# Patient Record
Sex: Female | Born: 1945
Health system: Southern US, Community
[De-identification: ages and names within clinical notes are randomized; demographics above are authoritative.]

## PROBLEM LIST (undated history)

## (undated) DIAGNOSIS — I83893 Varicose veins of bilateral lower extremities with other complications: Secondary | ICD-10-CM

## (undated) DIAGNOSIS — J45909 Unspecified asthma, uncomplicated: Secondary | ICD-10-CM

## (undated) DIAGNOSIS — M1990 Unspecified osteoarthritis, unspecified site: Secondary | ICD-10-CM

## (undated) DIAGNOSIS — Z9889 Other specified postprocedural states: Secondary | ICD-10-CM

## (undated) DIAGNOSIS — R112 Nausea with vomiting, unspecified: Secondary | ICD-10-CM

## (undated) DIAGNOSIS — Z8719 Personal history of other diseases of the digestive system: Secondary | ICD-10-CM

## (undated) DIAGNOSIS — D649 Anemia, unspecified: Secondary | ICD-10-CM

## (undated) HISTORY — PX: ADENOIDECTOMY: SUR15

## (undated) HISTORY — PX: TUBAL LIGATION: SHX77

## (undated) HISTORY — PX: KNEE CARTILAGE SURGERY: SHX688

## (undated) HISTORY — DX: Varicose veins of bilateral lower extremities with other complications: I83.893

---

## 1968-03-14 HISTORY — PX: FOOT SURGERY: SHX648

## 1997-08-19 ENCOUNTER — Other Ambulatory Visit: Admission: RE | Admit: 1997-08-19 | Discharge: 1997-08-19 | Payer: Self-pay | Admitting: Obstetrics and Gynecology

## 1998-08-24 ENCOUNTER — Other Ambulatory Visit: Admission: RE | Admit: 1998-08-24 | Discharge: 1998-08-24 | Payer: Self-pay | Admitting: Obstetrics and Gynecology

## 1999-08-31 ENCOUNTER — Other Ambulatory Visit: Admission: RE | Admit: 1999-08-31 | Discharge: 1999-08-31 | Payer: Self-pay | Admitting: Obstetrics and Gynecology

## 1999-09-09 ENCOUNTER — Encounter: Payer: Self-pay | Admitting: Obstetrics and Gynecology

## 1999-09-09 ENCOUNTER — Encounter: Admission: RE | Admit: 1999-09-09 | Discharge: 1999-09-09 | Payer: Self-pay | Admitting: Obstetrics and Gynecology

## 2000-09-05 ENCOUNTER — Other Ambulatory Visit: Admission: RE | Admit: 2000-09-05 | Discharge: 2000-09-05 | Payer: Self-pay | Admitting: Obstetrics and Gynecology

## 2000-09-20 ENCOUNTER — Encounter: Admission: RE | Admit: 2000-09-20 | Discharge: 2000-09-20 | Payer: Self-pay | Admitting: Obstetrics and Gynecology

## 2000-09-20 ENCOUNTER — Encounter: Payer: Self-pay | Admitting: Obstetrics and Gynecology

## 2001-09-10 ENCOUNTER — Other Ambulatory Visit: Admission: RE | Admit: 2001-09-10 | Discharge: 2001-09-10 | Payer: Self-pay | Admitting: Obstetrics and Gynecology

## 2001-09-26 ENCOUNTER — Encounter: Payer: Self-pay | Admitting: Obstetrics and Gynecology

## 2001-09-26 ENCOUNTER — Encounter: Admission: RE | Admit: 2001-09-26 | Discharge: 2001-09-26 | Payer: Self-pay | Admitting: Obstetrics and Gynecology

## 2002-08-26 ENCOUNTER — Ambulatory Visit (HOSPITAL_COMMUNITY): Admission: RE | Admit: 2002-08-26 | Discharge: 2002-08-26 | Payer: Self-pay | Admitting: Gastroenterology

## 2002-09-20 ENCOUNTER — Other Ambulatory Visit: Admission: RE | Admit: 2002-09-20 | Discharge: 2002-09-20 | Payer: Self-pay | Admitting: Obstetrics and Gynecology

## 2002-10-16 ENCOUNTER — Encounter: Payer: Self-pay | Admitting: Obstetrics and Gynecology

## 2002-10-16 ENCOUNTER — Encounter: Admission: RE | Admit: 2002-10-16 | Discharge: 2002-10-16 | Payer: Self-pay | Admitting: Obstetrics and Gynecology

## 2003-11-20 ENCOUNTER — Ambulatory Visit (HOSPITAL_COMMUNITY): Admission: RE | Admit: 2003-11-20 | Discharge: 2003-11-20 | Payer: Self-pay | Admitting: Obstetrics and Gynecology

## 2004-01-20 ENCOUNTER — Other Ambulatory Visit: Admission: RE | Admit: 2004-01-20 | Discharge: 2004-01-20 | Payer: Self-pay | Admitting: Obstetrics and Gynecology

## 2004-02-16 ENCOUNTER — Encounter: Admission: RE | Admit: 2004-02-16 | Discharge: 2004-05-16 | Payer: Self-pay | Admitting: Obstetrics and Gynecology

## 2004-05-18 ENCOUNTER — Emergency Department (HOSPITAL_COMMUNITY): Admission: EM | Admit: 2004-05-18 | Discharge: 2004-05-18 | Payer: Self-pay | Admitting: Emergency Medicine

## 2005-02-14 ENCOUNTER — Ambulatory Visit (HOSPITAL_COMMUNITY): Admission: RE | Admit: 2005-02-14 | Discharge: 2005-02-14 | Payer: Self-pay | Admitting: Obstetrics and Gynecology

## 2005-02-23 ENCOUNTER — Encounter: Admission: RE | Admit: 2005-02-23 | Discharge: 2005-02-23 | Payer: Self-pay | Admitting: Obstetrics and Gynecology

## 2005-03-23 ENCOUNTER — Other Ambulatory Visit: Admission: RE | Admit: 2005-03-23 | Discharge: 2005-03-23 | Payer: Self-pay | Admitting: Obstetrics and Gynecology

## 2006-02-20 ENCOUNTER — Ambulatory Visit (HOSPITAL_COMMUNITY): Admission: RE | Admit: 2006-02-20 | Discharge: 2006-02-20 | Payer: Self-pay | Admitting: Obstetrics and Gynecology

## 2006-03-01 ENCOUNTER — Encounter: Admission: RE | Admit: 2006-03-01 | Discharge: 2006-03-01 | Payer: Self-pay | Admitting: Obstetrics and Gynecology

## 2007-02-27 ENCOUNTER — Encounter: Admission: RE | Admit: 2007-02-27 | Discharge: 2007-02-27 | Payer: Self-pay | Admitting: Obstetrics and Gynecology

## 2007-07-11 ENCOUNTER — Ambulatory Visit (HOSPITAL_BASED_OUTPATIENT_CLINIC_OR_DEPARTMENT_OTHER): Admission: RE | Admit: 2007-07-11 | Discharge: 2007-07-11 | Payer: Self-pay | Admitting: Orthopedic Surgery

## 2008-02-28 ENCOUNTER — Encounter: Admission: RE | Admit: 2008-02-28 | Discharge: 2008-02-28 | Payer: Self-pay | Admitting: Obstetrics and Gynecology

## 2009-03-02 ENCOUNTER — Encounter: Admission: RE | Admit: 2009-03-02 | Discharge: 2009-03-02 | Payer: Self-pay | Admitting: Obstetrics and Gynecology

## 2010-03-03 ENCOUNTER — Encounter
Admission: RE | Admit: 2010-03-03 | Discharge: 2010-03-03 | Payer: Self-pay | Source: Home / Self Care | Attending: Obstetrics and Gynecology | Admitting: Obstetrics and Gynecology

## 2010-04-04 ENCOUNTER — Encounter: Payer: Self-pay | Admitting: Obstetrics and Gynecology

## 2010-07-27 NOTE — Op Note (Signed)
NAMEKYLE, STANSELL                ACCOUNT NO.:  [id number]   MEDICAL RECORD NO.:  [id number]          PATIENT TYPE:  AMB   LOCATION:  [facility]                         FACILITY:  [facility]   PHYSICIAN:  Ollen Gross, M.D.    DATE OF BIRTH:  1945-08-11   DATE OF PROCEDURE:  07/11/2007  DATE OF DISCHARGE:                               OPERATIVE REPORT   PREOPERATIVE DIAGNOSIS:  Right knee lateral meniscal tear.   POSTOPERATIVE DIAGNOSIS:  Right knee lateral meniscal tear.   PROCEDURE:  Right knee arthroscopy with lateral meniscal debridement.   SURGEON:  Ollen Gross, M.D.   ASSISTANT:  None.   ANESTHESIA:  General.   BLOOD LOSS:  Minimal.   DRAINS:  None.   COMPLICATIONS:  None.   CONDITION:  Stable to recovery.   CLINICAL NOTE:  Marie Farrell is a 65 year old female with significant right  knee pain, mechanical symptoms and these have progressively worsened  over time.  She has documented lateral meniscal tear as well as some  arthritis in the lateral compartment.  Given her significant worsening  pain and mechanical symptoms, it is decided to perform arthroscopic  debridement.   PROCEDURE IN DETAIL:  After successful administration of general  anesthetic, a tourniquet is placed high on the right side and right  lower extremity prepped and draped in the usual sterile fashion.  Standard superomedial and inferolateral incisions are made.  Inflow  cannula passed superomedial and camera passed inferolateral.  Arthroscopic visualization proceeds.  Undersurface of patella shows some  grade 2 chondromalacia, no full-thickness chondral defects.  The  trochlea looks normal.  The medial and lateral gutters are visualized.  There are no loose bodies.  Flexion and valgus force applied to the knee  and the medial compartment is entered.  The medial compartment has  minimal chondromalacia and no meniscal tear.  The spinal needle is used  to localize the inferomedial portal, small incision made  and dilator  placed.  We then visualized the intercondylar notch and the ACL is  normal.  Lateral compartment centered and there is evidence of a  significant lesion on the lateral tibial plateau with exposed bone  posterolaterally about a 1 x 2 cm area.  The lateral meniscus is also  significantly torn and degenerated.  From the body all through the  posterior horn, there is significant tearing in degeneration.  The  lateral femoral condyle looks fairly normal.  The meniscus is debrided  back to a stable rim with baskets and a 4.2 mm shaver and sealed off  with the ArthroCare device.  As there was exposed bone on the tibial  plateau, I was not able to do a chondroplasty.  The patellofemoral joint  is then addressed and any unstable cartilage on the undersurface of the  patella is debrided back to stable base.  We then reinspected the joint  and did not note any other tears or loose bodies.  The arthroscopic  equipment is then removed from the inferior portals which are closed  with interrupted 4-0 nylon.  We injected 20 mL of 25% Marcaine with  epinephrine through the inflow cannula, then that is removed and that  portal closed with nylon.  A bulky sterile dressing is then applied and  she is awakened and transferred to recovery in stable condition.      Ollen Gross, M.D.  Electronically Signed     FA/MEDQ  D:  07/11/2007  T:  07/11/2007  Job:  [id number]

## 2010-07-30 NOTE — Op Note (Signed)
   NAME:  Marie Farrell, Marie Farrell                          ACCOUNT NO.:  [id number]   MEDICAL RECORD NO.:  [id number]                   PATIENT TYPE:  AMB   LOCATION:  ENDO                                 FACILITY:  [facility]   PHYSICIAN:  Marie Farrell, M.D.                DATE OF BIRTH:  1945/06/14   DATE OF PROCEDURE:  08/26/2002  DATE OF DISCHARGE:                                 OPERATIVE REPORT   PROCEDURE:  Colonoscopy.   PROCEDURE INDICATION:  [name] is a 65 year old female, born  16-Feb-1946.  [name] is scheduled to undergo her first screening  colonoscopy with polypectomy to prevent colon cancer.   ENDOSCOPIST:  Charolett Bumpers, M.D.   PREMEDICATION:  1. Fentanyl 75 mcg.  2. Versed 7 mg.   DESCRIPTION OF PROCEDURE:  After obtaining informed consent, [name] was  placed in the left lateral decubitus position.  I administered intravenous  fentanyl and intravenous Versed to achieve conscious sedation for the  procedure.  The patient's blood pressure, oxygen saturation, and cardiac  rhythm were monitored throughout the procedure and documented in the medical  record.   Anal inspection was normal.  Digital rectal exam was normal.  The Olympus  pediatric adjustable colonoscope was introduced into the rectum and easily  advanced to the cecum.  Colonic preparation for the exam today was  excellent.   RECTUM:  Normal.  SIGMOID COLON AND DESCENDING COLON:  Normal.  SPLENIC FLEXURE:  Normal.  TRANSVERSE COLON:  Normal.  HEPATIC FLEXURE:  Normal.  ASCENDING COLON:  Normal.  CECUM AND ILEOCECAL VALVE:  Normal.    ASSESSMENT:  Normal screening proctocolonoscopy to the cecum.  No endoscopic  evidence for the presence of colorectal neoplasia.                                               Marie Farrell, M.D.    MJ/MEDQ  D:  08/26/2002  T:  08/26/2002  Job:  [id number]   cc:   Juluis Mire, M.D.  [address] [place]  [place] [address]  Fax:  [phone]

## 2010-12-07 LAB — POCT HEMOGLOBIN-HEMACUE: Hemoglobin: 14

## 2011-01-27 ENCOUNTER — Other Ambulatory Visit: Payer: Self-pay | Admitting: Obstetrics and Gynecology

## 2011-01-27 DIAGNOSIS — Z1231 Encounter for screening mammogram for malignant neoplasm of breast: Secondary | ICD-10-CM

## 2011-03-10 ENCOUNTER — Ambulatory Visit
Admission: RE | Admit: 2011-03-10 | Discharge: 2011-03-10 | Disposition: A | Discharge status: Home / Self Care | Payer: BC Managed Care – PPO | Source: Ambulatory Visit | Attending: Obstetrics and Gynecology | Admitting: Obstetrics and Gynecology

## 2011-03-10 DIAGNOSIS — Z1231 Encounter for screening mammogram for malignant neoplasm of breast: Secondary | ICD-10-CM

## 2011-03-18 ENCOUNTER — Other Ambulatory Visit: Payer: Self-pay | Admitting: Obstetrics and Gynecology

## 2011-03-18 DIAGNOSIS — R928 Other abnormal and inconclusive findings on diagnostic imaging of breast: Secondary | ICD-10-CM

## 2011-03-28 ENCOUNTER — Ambulatory Visit
Admission: RE | Admit: 2011-03-28 | Discharge: 2011-03-28 | Disposition: A | Discharge status: Home / Self Care | Payer: BC Managed Care – PPO | Source: Ambulatory Visit | Attending: Obstetrics and Gynecology | Admitting: Obstetrics and Gynecology

## 2011-03-28 DIAGNOSIS — R928 Other abnormal and inconclusive findings on diagnostic imaging of breast: Secondary | ICD-10-CM

## 2012-02-27 ENCOUNTER — Other Ambulatory Visit: Payer: Self-pay | Admitting: Obstetrics and Gynecology

## 2012-02-27 DIAGNOSIS — Z1231 Encounter for screening mammogram for malignant neoplasm of breast: Secondary | ICD-10-CM

## 2012-04-09 ENCOUNTER — Ambulatory Visit
Admission: RE | Admit: 2012-04-09 | Discharge: 2012-04-09 | Disposition: A | Discharge status: Home / Self Care | Payer: BC Managed Care – PPO | Source: Ambulatory Visit | Attending: Obstetrics and Gynecology | Admitting: Obstetrics and Gynecology

## 2012-04-09 DIAGNOSIS — Z1231 Encounter for screening mammogram for malignant neoplasm of breast: Secondary | ICD-10-CM

## 2013-03-18 ENCOUNTER — Other Ambulatory Visit: Payer: Self-pay

## 2013-03-18 DIAGNOSIS — Z1231 Encounter for screening mammogram for malignant neoplasm of breast: Secondary | ICD-10-CM

## 2013-04-10 ENCOUNTER — Ambulatory Visit
Admission: RE | Admit: 2013-04-10 | Discharge: 2013-04-10 | Disposition: A | Discharge status: Home / Self Care | Payer: BC Managed Care – PPO | Source: Ambulatory Visit

## 2013-04-10 DIAGNOSIS — Z1231 Encounter for screening mammogram for malignant neoplasm of breast: Secondary | ICD-10-CM

## 2014-03-21 ENCOUNTER — Other Ambulatory Visit: Payer: Self-pay

## 2014-03-21 DIAGNOSIS — Z1231 Encounter for screening mammogram for malignant neoplasm of breast: Secondary | ICD-10-CM

## 2014-04-22 ENCOUNTER — Ambulatory Visit
Admission: RE | Admit: 2014-04-22 | Discharge: 2014-04-22 | Disposition: A | Discharge status: Home / Self Care | Payer: Medicare Other | Source: Ambulatory Visit

## 2014-04-22 DIAGNOSIS — Z1231 Encounter for screening mammogram for malignant neoplasm of breast: Secondary | ICD-10-CM

## 2014-10-01 ENCOUNTER — Other Ambulatory Visit: Payer: Self-pay | Admitting: Gastroenterology

## 2014-11-26 ENCOUNTER — Encounter (HOSPITAL_COMMUNITY): Payer: Self-pay | Admitting: *Deleted

## 2014-12-09 ENCOUNTER — Ambulatory Visit (HOSPITAL_COMMUNITY)
Admission: RE | Admit: 2014-12-09 | Discharge: 2014-12-09 | Disposition: A | Discharge status: Home / Self Care | Payer: Medicare Other | Source: Ambulatory Visit | Attending: Gastroenterology | Admitting: Gastroenterology

## 2014-12-09 ENCOUNTER — Ambulatory Visit (HOSPITAL_COMMUNITY): Payer: Medicare Other | Admitting: Certified Registered Nurse Anesthetist

## 2014-12-09 ENCOUNTER — Encounter (HOSPITAL_COMMUNITY)
Admission: RE | Disposition: A | Discharge status: Home / Self Care | Payer: Self-pay | Source: Ambulatory Visit | Attending: Gastroenterology

## 2014-12-09 ENCOUNTER — Encounter (HOSPITAL_COMMUNITY): Payer: Self-pay

## 2014-12-09 DIAGNOSIS — Z1211 Encounter for screening for malignant neoplasm of colon: Secondary | ICD-10-CM | POA: Insufficient documentation

## 2014-12-09 DIAGNOSIS — J45909 Unspecified asthma, uncomplicated: Secondary | ICD-10-CM | POA: Insufficient documentation

## 2014-12-09 DIAGNOSIS — K449 Diaphragmatic hernia without obstruction or gangrene: Secondary | ICD-10-CM | POA: Insufficient documentation

## 2014-12-09 HISTORY — DX: Personal history of other diseases of the digestive system: Z87.19

## 2014-12-09 HISTORY — DX: Unspecified asthma, uncomplicated: J45.909

## 2014-12-09 HISTORY — DX: Anemia, unspecified: D64.9

## 2014-12-09 HISTORY — PX: COLONOSCOPY WITH PROPOFOL: SHX5780

## 2014-12-09 HISTORY — DX: Nausea with vomiting, unspecified: R11.2

## 2014-12-09 HISTORY — DX: Other specified postprocedural states: Z98.890

## 2014-12-09 SURGERY — COLONOSCOPY WITH PROPOFOL
Anesthesia: Monitor Anesthesia Care

## 2014-12-09 MED ORDER — LACTATED RINGERS IV SOLN
INTRAVENOUS | Status: DC
  Administered 2014-12-09: 1000 mL via INTRAVENOUS

## 2014-12-09 MED ORDER — PROPOFOL 10 MG/ML IV BOLUS
INTRAVENOUS | Status: AC
Start: 2014-12-09 — End: 2014-12-09
  Filled 2014-12-09: qty 20

## 2014-12-09 MED ORDER — PROPOFOL 10 MG/ML IV BOLUS
INTRAVENOUS | Status: AC
  Filled 2014-12-09: qty 20

## 2014-12-09 MED ORDER — LIDOCAINE HCL (CARDIAC) 20 MG/ML IV SOLN
INTRAVENOUS | Status: AC
  Filled 2014-12-09: qty 5

## 2014-12-09 MED ORDER — PROPOFOL 500 MG/50ML IV EMUL
INTRAVENOUS | Status: DC | PRN
  Administered 2014-12-09: 300 ug/kg/min via INTRAVENOUS

## 2014-12-09 MED ORDER — LIDOCAINE HCL (CARDIAC) 20 MG/ML IV SOLN
INTRAVENOUS | Status: DC | PRN
  Administered 2014-12-09: 100 mg via INTRAVENOUS

## 2014-12-09 MED ORDER — SODIUM CHLORIDE 0.9 % IV SOLN: INTRAVENOUS | Status: DC

## 2014-12-09 MED ORDER — ONDANSETRON HCL 4 MG/2ML IJ SOLN
INTRAMUSCULAR | Status: AC
  Filled 2014-12-09: qty 2

## 2014-12-09 MED ORDER — ONDANSETRON HCL 4 MG/2ML IJ SOLN
INTRAMUSCULAR | Status: DC | PRN
  Administered 2014-12-09: 4 mg via INTRAVENOUS

## 2014-12-09 SURGICAL SUPPLY — 21 items

## 2014-12-09 NOTE — Anesthesia Postprocedure Evaluation (Signed)
  Anesthesia Post-op Note  Patient: Marie Farrell  Procedure(s) Performed: Procedure(s): COLONOSCOPY WITH PROPOFOL (N/A)  Patient Location: PACU and Endoscopy Unit  Anesthesia Type:MAC  Level of Consciousness: awake  Airway and Oxygen Therapy: Patient Spontanous Breathing  Post-op Pain: mild  Post-op Assessment: Post-op Vital signs reviewed              Post-op Vital Signs: Reviewed  Last Vitals:  Filed Vitals:   12/09/14 0910  BP: 104/64  Pulse: 68  Temp:   Resp: 18    Complications: No apparent anesthesia complications

## 2014-12-09 NOTE — Op Note (Signed)
Procedure: Screening colonoscopy. Normal screening colonoscopy performed on 08/26/2002  Endoscopist: Danise Edge  Sedation: Propofol administered by anesthesia  Procedure: The patient was placed in the left lateral decubitus position. Anal inspection and digital rectal exam were normal. The Pentax pediatric colonoscope was introduced into the rectum and advanced to the cecum. A normal-appearing appendiceal orifice and ileocecal valve were identified. Colonic preparation for the exam today was good. Withdrawal time was 10 minutes  Rectum. Normal. Retroflexed view of the distal rectum was normal  Sigmoid colon and descending colon. Normal  Splenic flexure. Normal  Transverse colon. Normal  Hepatic flexure. Normal  Ascending colon. Normal  Cecum and ileocecal valve. Normal  Assessment: Normal screening colonoscopy.

## 2014-12-09 NOTE — Anesthesia Preprocedure Evaluation (Addendum)
Anesthesia Evaluation  Patient identified by MRN, date of birth, ID band Patient awake    Reviewed: Allergy & Precautions, NPO status , Patient's Chart, lab work & pertinent test results  History of Anesthesia Complications (+) PONV  Airway Mallampati: II  TM Distance: >3 FB Neck ROM: Full    Dental   Pulmonary asthma ,    breath sounds clear to auscultation       Cardiovascular negative cardio ROS   Rhythm:Regular Rate:Normal     Neuro/Psych    GI/Hepatic Neg liver ROS, hiatal hernia,   Endo/Other  negative endocrine ROS  Renal/GU negative Renal ROS     Musculoskeletal   Abdominal   Peds  Hematology   Anesthesia Other Findings   Reproductive/Obstetrics                             Anesthesia Physical Anesthesia Plan  ASA: II  Anesthesia Plan: MAC   Post-op Pain Management:    Induction: Intravenous  Airway Management Planned: Nasal Cannula  Additional Equipment:   Intra-op Plan:   Post-operative Plan:   Informed Consent: I have reviewed the patients History and Physical, chart, labs and discussed the procedure including the risks, benefits and alternatives for the proposed anesthesia with the patient or authorized representative who has indicated his/her understanding and acceptance.   Dental advisory given  Plan Discussed with: CRNA and Anesthesiologist  Anesthesia Plan Comments:        Anesthesia Quick Evaluation

## 2014-12-09 NOTE — H&P (Signed)
  Procedure: Screening colonoscopy. Normal screening colonoscopy performed on 08/26/2002  History: The patient is a 69 year old female born to//1947. She is scheduled to undergo a screening colonoscopy today.  Past medical history: Left knee arthroscopy. Mohs surgery melanoma  Medication allergies: Codeine causes nausea and vomiting  Exam: The patient is alert and lying comfortably on the endoscopy stretcher. Abdomen is soft and nontender to palpation. Lungs are clear  to auscultation. Cardiac exam reveals a regular rhythm.  Plan: Proceed with screening colonoscopy

## 2014-12-09 NOTE — Discharge Instructions (Signed)
Colonoscopy, Care After °Refer to this sheet in the next few weeks. These instructions provide you with information on caring for yourself after your procedure. Your health care provider may also give you more specific instructions. Your treatment has been planned according to current medical practices, but problems sometimes occur. Call your health care provider if you have any problems or questions after your procedure. °WHAT TO EXPECT AFTER THE PROCEDURE  °After your procedure, it is typical to have the following: °· A small amount of blood in your stool. °· Moderate amounts of gas and mild abdominal cramping or bloating. °HOME CARE INSTRUCTIONS °· Do not drive, operate machinery, or sign important documents for 24 hours. °· You may shower and resume your regular physical activities, but move at a slower pace for the first 24 hours. °· Take frequent rest periods for the first 24 hours. °· Walk around or put a warm pack on your abdomen to help reduce abdominal cramping and bloating. °· Drink enough fluids to keep your urine clear or pale yellow. °· You may resume your normal diet as instructed by your health care provider. Avoid heavy or fried foods that are hard to digest. °· Avoid drinking alcohol for 24 hours or as instructed by your health care provider. °· Only take over-the-counter or prescription medicines as directed by your health care provider. °· If a tissue sample (biopsy) was taken during your procedure: °· Do not take aspirin or blood thinners for 7 days, or as instructed by your health care provider. °· Do not drink alcohol for 7 days, or as instructed by your health care provider. °· Eat soft foods for the first 24 hours. °SEEK MEDICAL CARE IF: °You have persistent spotting of blood in your stool 2-3 days after the procedure. °SEEK IMMEDIATE MEDICAL CARE IF: °· You have more than a small spotting of blood in your stool. °· You pass large blood clots in your stool. °· Your abdomen is swollen  (distended). °· You have nausea or vomiting. °· You have a fever. °· You have increasing abdominal pain that is not relieved with medicine. °Document Released: 10/13/2003 Document Revised: 12/19/2012 Document Reviewed: 11/05/2012 °ExitCare® Patient Information ©2015 [employer]. This information is not intended to replace advice given to you by your health care provider. Make sure you discuss any questions you have with your health care provider. ° °Monitored Anesthesia Care °Monitored anesthesia care is an anesthesia service for a medical procedure. Anesthesia is the loss of the ability to feel pain. It is produced by medicines called anesthetics. It may affect a small area of your body (local anesthesia), a large area of your body (regional anesthesia), or your entire body (general anesthesia). The need for monitored anesthesia care depends your procedure, your condition, and the potential need for regional or general anesthesia. It is often provided during procedures where:  °· General anesthesia may be needed if there are complications. This is because you need special care when you are under general anesthesia.   °· You will be under local or regional anesthesia. This is so that you are able to have higher levels of anesthesia if needed.   °· You will receive calming medicines (sedatives). This is especially the case if sedatives are given to put you in a semi-conscious state of relaxation (deep sedation). This is because the amount of sedative needed to produce this state can be hard to predict. Too much of a sedative can produce general anesthesia. °Monitored anesthesia care is performed by one   or more health care providers who have special training in all types of anesthesia. You will need to meet with these health care providers before your procedure. During this meeting, they will ask you about your medical history. They will also give you instructions to follow. (For example, you will need to stop  eating and drinking before your procedure. You may also need to stop or change medicines you are taking.) During your procedure, your health care providers will stay with you. They will:  °· Watch your condition. This includes watching your blood pressure, breathing, and level of pain.   °· Diagnose and treat problems that occur.   °· Give medicines if they are needed. These may include calming medicines (sedatives) and anesthetics.   °· Make sure you are comfortable.   °Having monitored anesthesia care does not necessarily mean that you will be under anesthesia. It does mean that your health care providers will be able to manage anesthesia if you need it or if it occurs. It also means that you will be able to have a different type of anesthesia than you are having if you need it. When your procedure is complete, your health care providers will continue to watch your condition. They will make sure any medicines wear off before you are allowed to go home.  °Document Released: 11/24/2004 Document Revised: 07/15/2013 Document Reviewed: 04/11/2012 °ExitCare® Patient Information ©2015 [employer]. This information is not intended to replace advice given to you by your health care provider. Make sure you discuss any questions you have with your health care provider. ° ° °

## 2014-12-09 NOTE — Transfer of Care (Signed)
Immediate Anesthesia Transfer of Care Note  Patient: Marie Farrell  Procedure(s) Performed: Procedure(s): COLONOSCOPY WITH PROPOFOL (N/A)  Patient Location: PACU  Anesthesia Type:MAC  Level of Consciousness:  sedated, patient cooperative and responds to stimulation  Airway & Oxygen Therapy:Patient Spontanous Breathing and Patient connected to face mask oxgen  Post-op Assessment:  Report given to PACU RN and Post -op Vital signs reviewed and stable  Post vital signs:  Reviewed and stable  Last Vitals:  Filed Vitals:   12/09/14 0814  BP: 132/72  Pulse: 73  Temp: 36.7 C  Resp: 18    Complications: No apparent anesthesia complications

## 2014-12-11 ENCOUNTER — Encounter (HOSPITAL_COMMUNITY): Payer: Self-pay | Admitting: Gastroenterology

## 2015-04-13 ENCOUNTER — Other Ambulatory Visit: Payer: Self-pay

## 2015-04-13 DIAGNOSIS — Z1231 Encounter for screening mammogram for malignant neoplasm of breast: Secondary | ICD-10-CM

## 2015-05-05 ENCOUNTER — Ambulatory Visit
Admission: RE | Admit: 2015-05-05 | Discharge: 2015-05-05 | Disposition: A | Discharge status: Home / Self Care | Payer: Medicare Other | Source: Ambulatory Visit

## 2015-05-05 DIAGNOSIS — Z1231 Encounter for screening mammogram for malignant neoplasm of breast: Secondary | ICD-10-CM

## 2016-03-01 ENCOUNTER — Encounter: Payer: Medicare Other | Admitting: Vascular Surgery

## 2016-03-04 ENCOUNTER — Encounter: Payer: Self-pay | Admitting: Vascular Surgery

## 2016-03-21 ENCOUNTER — Encounter: Payer: Medicare Other | Admitting: Vascular Surgery

## 2016-03-24 ENCOUNTER — Encounter: Payer: Self-pay | Admitting: Vascular Surgery

## 2016-03-28 ENCOUNTER — Encounter: Payer: Self-pay | Admitting: Vascular Surgery

## 2016-03-28 ENCOUNTER — Ambulatory Visit (INDEPENDENT_AMBULATORY_CARE_PROVIDER_SITE_OTHER): Payer: Medicare Other | Admitting: Vascular Surgery

## 2016-03-28 VITALS — BP 132/83 | HR 96 | Temp 98.0°F | Resp 16 | Ht 66.0 in | Wt 167.0 lb

## 2016-03-28 DIAGNOSIS — I781 Nevus, non-neoplastic: Secondary | ICD-10-CM | POA: Diagnosis not present

## 2016-03-28 NOTE — Progress Notes (Signed)
Subjective:     Patient ID: Marie Farrell, female   DOB: 03/14/1945, 71 y.o.   MRN: [id number]  HPI This 71 year old female was referred by Dr. Catha GosselinKevin Little for evaluation of varicose veins bilaterally with swelling left leg. Patient has had some mild swelling in the left lower leg over the past several years. She does not relax to compression stockings and she has no history of DVT thrombophlebitis stasis ulcers or bleeding. She has noted progressive spider veins in both legs in the lateral 5 posterior thigh and lateral calf areas. She [facility] but they recurred in 2 years. She denies any pain in her legs at this time. She has not had any recent ultrasound or vein evaluation.  Past Medical History:  Diagnosis Date  . Anemia    as a child none current   . Asthma    steroid, inhalors, antibiotics, last winter no current issues  . History of hiatal hernia    no treatment  . PONV (postoperative nausea and vomiting)     Social History  Substance Use Topics  . Smoking status: Never Smoker  . Smokeless tobacco: Never Used  . Alcohol use No    History reviewed. No pertinent family history.  Allergies  Allergen Reactions  . Codeine Nausea And Vomiting     Current Outpatient Prescriptions:  .  Cholecalciferol (VITAMIN D) 2000 UNITS CAPS, Take 2,000 Units by mouth daily., Disp: , Rfl:  .  glucosamine-chondroitin 500-400 MG tablet, Take 1 tablet by mouth daily., Disp: , Rfl:  .  ibuprofen (ADVIL,MOTRIN) 200 MG tablet, Take 400 mg by mouth every 6 (six) hours as needed for moderate pain., Disp: , Rfl:  .  Multiple Vitamin (MULTIVITAMIN WITH MINERALS) TABS tablet, Take 1 tablet by mouth daily., Disp: , Rfl:  .  Omega-3 Fatty Acids (FISH OIL PO), Take 1 capsule by mouth daily., Disp: , Rfl:  .  Probiotic Product (PROBIOTIC DAILY PO), Take 1 capsule by mouth daily., Disp: , Rfl:  .  acetaminophen (TYLENOL) 325 MG  tablet, Take 650 mg by mouth every 6 (six) hours as needed for moderate pain., Disp: , Rfl:  .  aspirin 81 MG tablet, Take 81 mg by mouth daily., Disp: , Rfl:   Vitals:   03/28/16 1201  BP: 132/83  Pulse: 96  Resp: 16  Temp: 98 F (36.7 C)  SpO2: 100%  Weight: 167 lb (75.8 kg)  Height: 5\' 6"  (1.676 m)    Body mass index is 26.95 kg/m.         Review of Systems Patient has history of occasional tachycardia with irregular heartbeat but no history of definite arrhythmias has been diagnosed. Also has history of arthroscopic surgery in the knee and removal of melanoma by Dr. Irene LimboGoodrich. Denies coronary artery disease area mild hyperlipidemia. Other systems negative and complete review of systems    Objective:   Physical Exam BP 132/83   Pulse 96   Temp 98 F (36.7 C)   Resp 16   Ht 5\' 6"  (1.676 m)   Wt 167 lb (75.8 kg)   SpO2 100%   BMI 26.95 kg/m     Gen.-alert and oriented x3 in no apparent distress HEENT normal for age Lungs no rhonchi or wheezing Cardiovascular regular rhythm no murmurs carotid pulses 3+ palpable no bruits audible Abdomen soft nontender no palpable masses Musculoskeletal free of  major deformities Skin clear -no rashes  Neurologic normal Lower extremities 3+ femoral and dorsalis pedis pulses palpable bilaterally with no edema on the right 1+ on the lower leg on the left. Diffuse spider telangiectasia lateral thighs bilaterally posterior thighs more prominently on the right medial and lateral calf. No hyperpigmentation or ulceration noted. No bulging varicosities noted.  Today I performed a bedside SonoSite ultrasound exam of both lower extremities in both great saphenous veins appear normal in caliber with no evidence of reflux     Assessment:     #1 bilateral spider telangiectasia-asymptomatic #2 swelling distal left leg-etiology unknown     Plan:     Have discussed foam sclerotherapy with patient and indicated that results can be variable  and that reoccurrence of spider veins is a possibility and she will consider this Have recommended elevation of foot of bed 2 inches and using lower leg elastic compression stockings 20-30 millimeter gradient to treat the edema in the left leg  Off her to her obtaining formal venous duplex exam which I do not think will change her treatment regimen and she declined Return to see me on a when necessary basis

## 2016-05-02 ENCOUNTER — Other Ambulatory Visit: Payer: Self-pay | Admitting: Family Medicine

## 2016-05-02 DIAGNOSIS — Z1231 Encounter for screening mammogram for malignant neoplasm of breast: Secondary | ICD-10-CM

## 2016-05-16 ENCOUNTER — Ambulatory Visit
Admission: RE | Admit: 2016-05-16 | Discharge: 2016-05-16 | Disposition: A | Discharge status: Home / Self Care | Payer: Medicare Other | Source: Ambulatory Visit | Attending: Family Medicine | Admitting: Family Medicine

## 2016-05-16 DIAGNOSIS — Z1231 Encounter for screening mammogram for malignant neoplasm of breast: Secondary | ICD-10-CM

## 2017-05-01 ENCOUNTER — Other Ambulatory Visit: Payer: Self-pay | Admitting: Family Medicine

## 2017-05-01 DIAGNOSIS — Z1231 Encounter for screening mammogram for malignant neoplasm of breast: Secondary | ICD-10-CM

## 2017-05-22 ENCOUNTER — Ambulatory Visit: Payer: Medicare Other

## 2017-05-23 ENCOUNTER — Other Ambulatory Visit: Payer: Self-pay

## 2017-05-23 ENCOUNTER — Encounter: Payer: Self-pay | Admitting: *Deleted

## 2017-05-29 NOTE — Discharge Instructions (Signed)
INSTRUCTIONS FOLLOWING OCULOPLASTIC SURGERY °AMY M. FOWLER, MD ° °AFTER YOUR EYE SURGERY, THER ARE MANY THINGS THWIHC YOU, THE PATIENT, CAN DO TO ASSURE THE BEST POSSIBLE RESULT FROM YOUR OPERATION.  THIS SHEET SHOULD BE REFERRED TO WHENEVER QUESTIONS ARISE.  IF THERE ARE ANY QUESTIONS NOT ANSWERED HERE, DO NOT HESITATE TO CALL OUR OFFICE AT [phone] OR [phone].  THERE IS ALWAYS [facility] AVAILABLE TO CALL IF QUESTIONS OR PROBLEMS ARISE. ° °VISION: Your vision may be blurred and out of focus after surgery until you are able to stop using your ointment, swelling resolves and your eye(s) heal. This may take 1 to 2 weeks at the least.  If your vision becomes gradually more dim or dark, this is not normal and you need to call our office immediately. ° °EYE CARE: For the first 48 hours after surgery, use ice packs frequently - “20 minutes on, 20 minutes off” - to help reduce swelling and bruising.  Small bags of frozen peas or corn make good ice packs along with cloths soaked in ice water.  If you are wearing a patch or other type of dressing following surgery, keep this on for the amount of time specified by your doctor.  For the first week following surgery, you will need to treat your stitches with great care.  If is OK to shower, but take care to not allow soapy water to run into your eye(s) to help reduce changes of infection.  You may gently clean the eyelashes and around the eye(s) with cotton balls and sterile water, BUT DO NOT RUB THE STITCHES VIGOROUSLY.  Keeping your stitches moist with ointment will help promote healing with minimal scar formation. ° °ACTIVITY: When you leave the surgery center, you should go home, rest and be inactive.  The eye(s) may feel scratchy and keeping the eyes closed will allow for faster healing.  The first week following surgery, avoid straining (anything making the face turn red) or lifting over 20 pounds.  Additionally, avoid bending which causes your head to go below  your waist.  Using your eyes will NOT harm them, so feel free to read, watch television, use the computer, etc as desired.  Driving depends on each individual, so check with your doctor if you have questions about driving. ° °MEDICATIONS:  You will be given a prescription for an ointment to use 4 times a day on your stitches.  You can use the ointment in your eyes if they feel scratchy or irritated.  If you eyelid(s) don’t close completely when you sleep, put some ointment in your eyes before bedtime. ° °EMERGENCY: If you experience SEVERE EYE PAIN OR HEADACHE UNRELIEVED BY TYLENOL OR PERCOCET, NAUSEA OR VOMITING, WORSENING REDNESS, OR WORSENING VISION (ESPECIALLY VISION THAT WA INITIALLY BETTER) CALL [phone] OR [phone] DURING BUSINESS HOURS OR AFTER HOURS. ° °General Anesthesia, Adult, Care After °These instructions provide you with information about caring for yourself after your procedure. Your health care provider may also give you more specific instructions. Your treatment has been planned according to current medical practices, but problems sometimes occur. Call your health care provider if you have any problems or questions after your procedure. °What can I expect after the procedure? °After the procedure, it is common to have: °· Vomiting. °· A sore throat. °· Mental slowness. ° °It is common to feel: °· Nauseous. °· Cold or shivery. °· Sleepy. °· Tired. °· Sore or achy, even in parts of your body where you did not have surgery. ° °  Follow these instructions at home: °For at least 24 hours after the procedure: °· Do not: °? Participate in activities where you could fall or become injured. °? Drive. °? Use heavy machinery. °? Drink alcohol. °? Take sleeping pills or medicines that cause drowsiness. °? Make important decisions or sign legal documents. °? Take care of children on your own. °· Rest. °Eating and drinking °· If you vomit, drink water, juice, or soup when you can drink without  vomiting. °· Drink enough fluid to keep your urine clear or pale yellow. °· Make sure you have little or no nausea before eating solid foods. °· Follow the diet recommended by your health care provider. °General instructions °· Have a responsible adult stay with you until you are awake and alert. °· Return to your normal activities as told by your health care provider. Ask your health care provider what activities are safe for you. °· Take over-the-counter and prescription medicines only as told by your health care provider. °· If you smoke, do not smoke without supervision. °· Keep all follow-up visits as told by your health care provider. This is important. °Contact a health care provider if: °· You continue to have nausea or vomiting at home, and medicines are not helpful. °· You cannot drink fluids or start eating again. °· You cannot urinate after 8-12 hours. °· You develop a skin rash. °· You have fever. °· You have increasing redness at the site of your procedure. °Get help right away if: °· You have difficulty breathing. °· You have chest pain. °· You have unexpected bleeding. °· You feel that you are having a life-threatening or urgent problem. °This information is not intended to replace advice given to you by your health care provider. Make sure you discuss any questions you have with your health care provider. °Document Released: 06/06/2000 Document Revised: 08/03/2015 Document Reviewed: 02/12/2015 °Elsevier Interactive Patient Education © 2018 [employer] ° °

## 2017-05-30 ENCOUNTER — Ambulatory Visit
Admission: RE | Admit: 2017-05-30 | Discharge: 2017-05-30 | Disposition: A | Discharge status: Home / Self Care | Payer: Medicare Other | Source: Ambulatory Visit | Attending: Ophthalmology | Admitting: Ophthalmology

## 2017-05-30 ENCOUNTER — Ambulatory Visit: Payer: Medicare Other | Admitting: Student in an Organized Health Care Education/Training Program

## 2017-05-30 ENCOUNTER — Encounter
Admission: RE | Disposition: A | Discharge status: Home / Self Care | Payer: Self-pay | Source: Ambulatory Visit | Attending: Ophthalmology

## 2017-05-30 DIAGNOSIS — Z885 Allergy status to narcotic agent status: Secondary | ICD-10-CM | POA: Insufficient documentation

## 2017-05-30 DIAGNOSIS — Z87891 Personal history of nicotine dependence: Secondary | ICD-10-CM | POA: Insufficient documentation

## 2017-05-30 DIAGNOSIS — Z79899 Other long term (current) drug therapy: Secondary | ICD-10-CM | POA: Diagnosis not present

## 2017-05-30 DIAGNOSIS — Z85820 Personal history of malignant melanoma of skin: Secondary | ICD-10-CM | POA: Diagnosis not present

## 2017-05-30 DIAGNOSIS — H57813 Brow ptosis, bilateral: Secondary | ICD-10-CM | POA: Insufficient documentation

## 2017-05-30 DIAGNOSIS — H02831 Dermatochalasis of right upper eyelid: Secondary | ICD-10-CM | POA: Insufficient documentation

## 2017-05-30 DIAGNOSIS — L574 Cutis laxa senilis: Secondary | ICD-10-CM | POA: Diagnosis not present

## 2017-05-30 DIAGNOSIS — M170 Bilateral primary osteoarthritis of knee: Secondary | ICD-10-CM | POA: Insufficient documentation

## 2017-05-30 DIAGNOSIS — H02834 Dermatochalasis of left upper eyelid: Secondary | ICD-10-CM | POA: Diagnosis not present

## 2017-05-30 HISTORY — PX: BROW LIFT: SHX178

## 2017-05-30 HISTORY — PX: BROW PTOSIS: SHX6727

## 2017-05-30 HISTORY — DX: Unspecified osteoarthritis, unspecified site: M19.90

## 2017-05-30 HISTORY — PX: ARTERY BIOPSY: SHX891

## 2017-05-30 SURGERY — BLEPHAROPLASTY
Anesthesia: General | Site: Eye | Laterality: Left | Wound class: Clean

## 2017-05-30 MED ORDER — ACETAMINOPHEN 160 MG/5ML PO SOLN: 325.0000 mg | ORAL | Status: DC | PRN

## 2017-05-30 MED ORDER — PROPOFOL 500 MG/50ML IV EMUL
INTRAVENOUS | Status: DC | PRN
  Administered 2017-05-30: 50 ug/kg/min via INTRAVENOUS

## 2017-05-30 MED ORDER — EPHEDRINE SULFATE 50 MG/ML IJ SOLN
INTRAMUSCULAR | Status: DC | PRN
  Administered 2017-05-30: 10 mg via INTRAVENOUS

## 2017-05-30 MED ORDER — TRAMADOL HCL 50 MG PO TABS: ORAL_TABLET | ORAL | 0 refills | Status: DC

## 2017-05-30 MED ORDER — ERYTHROMYCIN 5 MG/GM OP OINT
TOPICAL_OINTMENT | OPHTHALMIC | Status: DC | PRN
  Administered 2017-05-30: 1 via OPHTHALMIC

## 2017-05-30 MED ORDER — TETRACAINE HCL 0.5 % OP SOLN
OPHTHALMIC | Status: DC | PRN
  Administered 2017-05-30: 2 [drp] via OPHTHALMIC

## 2017-05-30 MED ORDER — LIDOCAINE-EPINEPHRINE 2 %-1:100000 IJ SOLN
INTRAMUSCULAR | Status: DC | PRN
  Administered 2017-05-30: 7 mL via OPHTHALMIC

## 2017-05-30 MED ORDER — ERYTHROMYCIN 5 MG/GM OP OINT: TOPICAL_OINTMENT | OPHTHALMIC | 3 refills | Status: DC

## 2017-05-30 MED ORDER — ALFENTANIL 500 MCG/ML IJ INJ
INJECTION | INTRAVENOUS | Status: DC | PRN
  Administered 2017-05-30: 500 ug via INTRAVENOUS

## 2017-05-30 MED ORDER — ACETAMINOPHEN 325 MG PO TABS: 650.0000 mg | ORAL_TABLET | Freq: Once | ORAL | Status: DC | PRN

## 2017-05-30 MED ORDER — LACTATED RINGERS IV SOLN
INTRAVENOUS | Status: DC
  Administered 2017-05-30: 11:00:00 via INTRAVENOUS

## 2017-05-30 MED ORDER — ONDANSETRON HCL 4 MG/2ML IJ SOLN: 4.0000 mg | Freq: Once | INTRAMUSCULAR | Status: DC | PRN

## 2017-05-30 MED ORDER — ONDANSETRON HCL 4 MG/2ML IJ SOLN
INTRAMUSCULAR | Status: DC | PRN
  Administered 2017-05-30: 4 mg via INTRAVENOUS

## 2017-05-30 MED ORDER — BSS IO SOLN
INTRAOCULAR | Status: DC | PRN
  Administered 2017-05-30: 15 mL

## 2017-05-30 MED ORDER — MIDAZOLAM HCL 2 MG/2ML IJ SOLN
INTRAMUSCULAR | Status: DC | PRN
  Administered 2017-05-30 (×4): 1 mg via INTRAVENOUS

## 2017-05-30 SURGICAL SUPPLY — 36 items
APPLICATOR COTTON TIP WD 3 STR (MISCELLANEOUS) ×8 IMPLANT
BLADE SURG 15 STRL LF DISP TIS (BLADE) ×3 IMPLANT
BLADE SURG 15 STRL SS (BLADE) ×1
CORD BIP STRL DISP 12FT (MISCELLANEOUS) ×4 IMPLANT
DRAPE HEAD BAR (DRAPES) ×4 IMPLANT
GAUZE SPONGE 4X4 12PLY STRL (GAUZE/BANDAGES/DRESSINGS) ×4 IMPLANT
GAUZE SPONGE NON-WVN 2X2 STRL (MISCELLANEOUS) ×30 IMPLANT
GLOVE SURG LX 7.0 MICRO (GLOVE) ×2
GLOVE SURG LX STRL 7.0 MICRO (GLOVE) ×6 IMPLANT
MARKER SKIN XFINE TIP W/RULER (MISCELLANEOUS) ×4 IMPLANT
NEEDLE FILTER BLUNT 18X 1/2SAF (NEEDLE) ×1
NEEDLE FILTER BLUNT 18X1 1/2 (NEEDLE) ×3 IMPLANT
NEEDLE HYPO 30X.5 LL (NEEDLE) ×8 IMPLANT
PACK DRAPE NASAL/ENT (PACKS) ×4 IMPLANT
SOL PREP PVP 2OZ (MISCELLANEOUS) ×4
SOLUTION PREP PVP 2OZ (MISCELLANEOUS) ×3 IMPLANT
SPONGE VERSALON 2X2 STRL (MISCELLANEOUS) ×10
SUT CHROMIC 4-0 (SUTURE)
SUT CHROMIC 4-0 M2 12X2 ARM (SUTURE)
SUT CHROMIC 5 0 P 3 (SUTURE) ×8 IMPLANT
SUT ETHILON 4 0 CL P 3 (SUTURE) IMPLANT
SUT MERSILENE 4-0 S-2 (SUTURE) IMPLANT
SUT PDS AB 4-0 P3 18 (SUTURE) IMPLANT
SUT PLAIN GUT (SUTURE) ×4 IMPLANT
SUT PROLENE 5 0 P 3 (SUTURE) ×4 IMPLANT
SUT PROLENE 6 0 P 1 18 (SUTURE) IMPLANT
SUT SILK 4 0 G 3 (SUTURE) IMPLANT
SUT VIC AB 5-0 P-3 18X BRD (SUTURE) IMPLANT
SUT VIC AB 5-0 P3 18 (SUTURE)
SUT VICRYL 6-0  S14 CTD (SUTURE)
SUT VICRYL 6-0 S14 CTD (SUTURE) IMPLANT
SUT VICRYL 7 0 TG140 8 (SUTURE) IMPLANT
SUTURE CHRMC 4-0 M2 12X2 ARM (SUTURE) IMPLANT
SYR 3ML LL SCALE MARK (SYRINGE) ×4 IMPLANT
SYRINGE 10CC LL (SYRINGE) ×4 IMPLANT
WATER STERILE IRR 250ML POUR (IV SOLUTION) ×4 IMPLANT

## 2017-05-30 NOTE — Interval H&P Note (Signed)
History and Physical Interval Note:  05/30/2017 11:05 AM  Marie Farrell  has presented today for surgery, with the diagnosis of H02.831 H02.834 DERMATOCHAIASIS L57.4 CUTIS TAXA SERILLS D48.5 NEOPLASM OF UNCERTAIN BEHAVIOR  The various methods of treatment have been discussed with the patient and family. After consideration of risks, benefits and other options for treatment, the patient has consented to  Procedure(s): BLEPHAROPLASTY UPPER EYELID W EXCESS SKIN (Bilateral) BROW PTOSIS REPAIR (Bilateral) BIOPSY EYE LOWER (Left) as a surgical intervention .  The patient's history has been reviewed, patient examined, no change in status, stable for surgery.  I have reviewed the patient's chart and labs.  Questions were answered to the patient's satisfaction.     Ether GriffinsFowler, Ringo Sherod M

## 2017-05-30 NOTE — Transfer of Care (Signed)
Immediate Anesthesia Transfer of Care Note  Patient: Marie Farrell  Procedure(s) Performed: BLEPHAROPLASTY UPPER EYELID W EXCESS SKIN (Bilateral Eye) BIOPSY EYE LOWER (Left Eye) BROW PTOSIS (Bilateral Eye)  Patient Location: PACU  Anesthesia Type: General  Level of Consciousness: awake, alert  and patient cooperative  Airway and Oxygen Therapy: Patient Spontanous Breathing and Patient connected to supplemental oxygen  Post-op Assessment: Post-op Vital signs reviewed, Patient's Cardiovascular Status Stable, Respiratory Function Stable, Patent Airway and No signs of Nausea or vomiting  Post-op Vital Signs: Reviewed and stable  Complications: No apparent anesthesia complications

## 2017-05-30 NOTE — Op Note (Signed)
Preoperative Diagnosis:   1.  Visually significant bilateral brow ptosis.  2.  Visually significant dermatochalasis bilateral upper Eyelid(s)  Postoperative Diagnosis: Same.   Procedure(s) Performed:   1.  Bilateral direct brow lift to improve vision.  2.  Upper eyelid blepharoplasty with excess skin excision bilateral upper Eyelid(s)  Teaching Surgeon: Philis Pique. Vickki Muff, M.D.   Assistants: None   Anesthesia: MAC  Specimens: None.  Estimated Blood Loss: Minimal.  Complications: None.  Operative Findings: None Dictated  PROCEDURE:   Allergies were reviewed and the patient is allergic to codeine..   After the risks, benefits, complications and alternatives were discussed with the patient, appropriate informed consent was obtained. While seated in an upright position and looking in primary gaze, the amount of supra-brow skin to be removed was measured and marked in an elliptical pattern. The patient was then brought to the operating suite and reclined supine.  Timeout was conducted and the patient was sedated. Local anesthetic consisting of a 50-50 mixture of 2% lidocaine with epinephrine and 0.75% bupivacaine with added Hylenex was injected subcutaneously to the bilateral brow region(s) and down to the periosteum and  subcutaneously to both upper eyelid(s). After adequate local was instilled, the patient was prepped and draped in the usual sterile fashion for eyelid surgery.   Attention was turned to the right brow region. A #15 blade was used to create a bevelled incision along the premarked incision line. A skin and subcutaneous tissue flap was then excised and hemostasis was obtained with bipolar cautery. The deep tissues were reapproximated with interrupted vertical 5-0 chromic sutures. The skin margin was reapproximated with a running locking 5-0 Prolene suture. Attention was then turned to the opposite brow region where the same procedure was performed in the same manner.     Attention was then turned to the upper eyelids. A 42m upper eyelid crease incision line was marked with calipers on both upper eyelid(s).  A pinch test was used to estimate the amount of excess skin to remove and this was marked in standard blepharoplasty style fashion. Attention was turned to the right upper eyelid. A #15 blade was used to open the premarked incision line. A skin only flap was excised and hemostasis was obtained with bipolar cautery.   Attention was then turned to the opposite eyelid where the same procedure was performed in the same manner.   The skin incisions were closed with a combination of interrupted and  running 6-0 fast absorbing plain gut suture.   The patient tolerated the procedure well.  Erythromycin ophthalmic ointment was applied to the incision site(s) followed by ice packs.The patient was taken to the recovery area where she recovered without difficulty.  Post-Op Plan/Instructions:  The patient was instructed to use ice packs frequently for the next 48 hours.  she  was instructed to use moist ophthalmic ointment on the eyelid incisions and over-the-counter antibiotic ointment on the brow sutures 4 times a day for the next 12 to 14 days. she was given a prescription for for pain control should Tylenol not be effective. she was asked to to follow up in 10 days time for suture removal or sooner as needed for problems.   Amy M. FVickki Muff M.D. Attending,Ophthalmology

## 2017-05-30 NOTE — Anesthesia Preprocedure Evaluation (Signed)
Anesthesia Evaluation  Patient identified by MRN, date of birth, ID band Patient awake    Reviewed: Allergy & Precautions, NPO status , Patient's Chart, lab work & pertinent test results  History of Anesthesia Complications (+) PONV and history of anesthetic complications  Airway Mallampati: I  TM Distance: >3 FB Neck ROM: Full    Dental no notable dental hx.    Pulmonary former smoker (quit 1970),    Pulmonary exam normal breath sounds clear to auscultation       Cardiovascular Exercise Tolerance: Good negative cardio ROS Normal cardiovascular exam Rhythm:Regular Rate:Normal     Neuro/Psych negative neurological ROS     GI/Hepatic negative GI ROS,   Endo/Other  negative endocrine ROS  Renal/GU negative Renal ROS     Musculoskeletal  (+) Arthritis , Osteoarthritis,    Abdominal   Peds  Hematology  (+) Blood dyscrasia, anemia ,   Anesthesia Other Findings   Reproductive/Obstetrics                             Anesthesia Physical Anesthesia Plan  ASA: II  Anesthesia Plan: General   Post-op Pain Management:    Induction: Intravenous  PONV Risk Score and Plan: 3 and TIVA and Propofol infusion  Airway Management Planned: Natural Airway  Additional Equipment:   Intra-op Plan:   Post-operative Plan:   Informed Consent: I have reviewed the patients History and Physical, chart, labs and discussed the procedure including the risks, benefits and alternatives for the proposed anesthesia with the patient or authorized representative who has indicated his/her understanding and acceptance.     Plan Discussed with: CRNA  Anesthesia Plan Comments:         Anesthesia Quick Evaluation

## 2017-05-30 NOTE — H&P (Signed)
See the history and physical completed at [facility] on 05/12/17 and scanned into the chart.   

## 2017-05-30 NOTE — Interval H&P Note (Signed)
History and Physical Interval Note:  05/30/2017 11:04 AM  Marie Farrell  has presented today for surgery, with the diagnosis of H02.831 H02.834 DERMATOCHAIASIS L57.4 CUTIS TAXA SERILLS D48.5 NEOPLASM OF UNCERTAIN BEHAVIOR  The various methods of treatment have been discussed with the patient and family. After consideration of risks, benefits and other options for treatment, the patient has consented to  Procedure(s): BLEPHAROPLASTY UPPER EYELID W EXCESS SKIN (Bilateral) BROW PTOSIS REPAIR (Bilateral) BIOPSY EYE LOWER (Left) as a surgical intervention .  The patient's history has been reviewed, patient examined, no change in status, stable for surgery.  I have reviewed the patient's chart and labs.  Questions were answered to the patient's satisfaction.     Ether GriffinsFowler, Blessing Zaucha M

## 2017-05-30 NOTE — Anesthesia Postprocedure Evaluation (Signed)
Anesthesia Post Note  Patient: Marie Farrell  Procedure(s) Performed: BLEPHAROPLASTY UPPER EYELID W EXCESS SKIN (Bilateral Eye) BIOPSY EYE LOWER (Left Eye) BROW PTOSIS (Bilateral Eye)  Patient location during evaluation: PACU Anesthesia Type: General Level of consciousness: awake and alert, oriented and patient cooperative Pain management: pain level controlled Vital Signs Assessment: post-procedure vital signs reviewed and stable Respiratory status: spontaneous breathing, nonlabored ventilation and respiratory function stable Cardiovascular status: blood pressure returned to baseline and stable Postop Assessment: adequate PO intake Anesthetic complications: no    Reed BreechAndrea Shayaan Parke

## 2017-05-31 ENCOUNTER — Encounter: Payer: Self-pay | Admitting: Ophthalmology

## 2017-06-01 LAB — SURGICAL PATHOLOGY

## 2017-06-27 ENCOUNTER — Ambulatory Visit
Admission: RE | Admit: 2017-06-27 | Discharge: 2017-06-27 | Disposition: A | Discharge status: Home / Self Care | Payer: Medicare Other | Source: Ambulatory Visit | Attending: Family Medicine | Admitting: Family Medicine

## 2017-06-27 DIAGNOSIS — Z1231 Encounter for screening mammogram for malignant neoplasm of breast: Secondary | ICD-10-CM

## 2017-11-29 DIAGNOSIS — M25551 Pain in right hip: Secondary | ICD-10-CM | POA: Insufficient documentation

## 2017-11-29 DIAGNOSIS — M25561 Pain in right knee: Secondary | ICD-10-CM | POA: Insufficient documentation

## 2018-04-12 ENCOUNTER — Other Ambulatory Visit: Payer: Self-pay | Admitting: *Deleted

## 2018-04-12 DIAGNOSIS — I83893 Varicose veins of bilateral lower extremities with other complications: Secondary | ICD-10-CM

## 2018-04-13 ENCOUNTER — Telehealth: Payer: Self-pay | Admitting: Vascular Surgery

## 2018-04-13 NOTE — Telephone Encounter (Signed)
-----   Message from Micah Flesher, RN sent at 04/12/2018  3:53 PM EST ----- Regarding: scheduling new vein appointment with venous reflux exam Please schedule new vein appointment for the above patient with Dr. Edilia Bo or Dr. Darrick Penna for VV and spider veins with bilateral leg swelling (L>R).  She needs a bilateral venous reflux exam with the MD appointment. She was seen by Dr. Hart Rochester 2 years ago but he did not have a venous reflux study done at the time and she did not follow up.  I will put order in for venous reflux study.   Thanks, [place] Laundry

## 2018-04-13 NOTE — Telephone Encounter (Signed)
sch appt spk to pt mld ltr 04/25/2018 1pm Bil LE reflux 215pm VV MD

## 2018-04-18 ENCOUNTER — Other Ambulatory Visit: Payer: Self-pay

## 2018-04-25 ENCOUNTER — Encounter: Payer: Medicare Other | Admitting: Vascular Surgery

## 2018-04-25 ENCOUNTER — Ambulatory Visit (HOSPITAL_COMMUNITY): Payer: Medicare Other

## 2018-05-23 ENCOUNTER — Other Ambulatory Visit: Payer: Self-pay | Admitting: Family Medicine

## 2018-05-23 ENCOUNTER — Ambulatory Visit (INDEPENDENT_AMBULATORY_CARE_PROVIDER_SITE_OTHER): Payer: Medicare Other | Admitting: Vascular Surgery

## 2018-05-23 ENCOUNTER — Other Ambulatory Visit: Payer: Self-pay

## 2018-05-23 ENCOUNTER — Encounter: Payer: Self-pay | Admitting: Vascular Surgery

## 2018-05-23 ENCOUNTER — Ambulatory Visit (HOSPITAL_COMMUNITY)
Admission: RE | Admit: 2018-05-23 | Discharge: 2018-05-23 | Disposition: A | Discharge status: Home / Self Care | Payer: Medicare Other | Source: Ambulatory Visit | Attending: Vascular Surgery | Admitting: Vascular Surgery

## 2018-05-23 VITALS — BP 132/86 | HR 77 | Temp 97.4°F | Resp 14 | Ht 65.5 in | Wt 161.0 lb

## 2018-05-23 DIAGNOSIS — I83893 Varicose veins of bilateral lower extremities with other complications: Secondary | ICD-10-CM | POA: Insufficient documentation

## 2018-05-23 DIAGNOSIS — I83813 Varicose veins of bilateral lower extremities with pain: Secondary | ICD-10-CM

## 2018-05-23 DIAGNOSIS — Z1231 Encounter for screening mammogram for malignant neoplasm of breast: Secondary | ICD-10-CM

## 2018-05-23 NOTE — Progress Notes (Signed)
Patient name: Marie Farrell MRN: [id number] DOB: [birth year] Sex: female   HPI: Marie Farrell is a 73 y.o. female with a longstanding history of spider type varicosities in both lower extremities.  She apparently had some saline injections of these about 30 years ago.  She stated she had started to have recurrences about 6 months after that.  She currently does not really have any symptoms of fullness heaviness achiness or stinging in her legs.  She primarily is worried about the appearance of them.  He does get some occasional swelling but this is fairly well-controlled with compression stockings which she had with her today.   Past Medical History:  Diagnosis Date  . Anemia    as a child none current   . Arthritis    knees, neck  . Asthma 2016-2017   winter - asthmatic bronchitis - resolved  . History of hiatal hernia    no treatment  . PONV (postoperative nausea and vomiting)    after surgery in 1970  . Varicose veins of leg with edema, bilateral    Past Surgical History:  Procedure Laterality Date  . ARTERY BIOPSY Left 05/30/2017   Procedure: BIOPSY EYE LOWER;  Surgeon: Imagene Riches, MD;  Location: [facility];  Service: Ophthalmology;  Laterality: Left;  . BROW LIFT Bilateral 05/30/2017   Procedure: BLEPHAROPLASTY UPPER EYELID W EXCESS SKIN;  Surgeon: Imagene Riches, MD;  Location: [facility];  Service: Ophthalmology;  Laterality: Bilateral;  . BROW PTOSIS Bilateral 05/30/2017   Procedure: BROW PTOSIS;  Surgeon: Imagene Riches, MD;  Location: [facility];  Service: Ophthalmology;  Laterality: Bilateral;  . COLONOSCOPY WITH PROPOFOL N/A 12/09/2014   Procedure: COLONOSCOPY WITH PROPOFOL;  Surgeon: Charolett Bumpers, MD;  Location: [facility];  Service: Endoscopy;  Laterality: N/A;  . FOOT SURGERY  1970   bone spurs  . KNEE CARTILAGE SURGERY     arthroscopic  . TUBAL LIGATION     1988    Family History  Problem Relation Age of Onset  . Diabetes Mother    . Cancer Mother   . Heart disease Father     SOCIAL HISTORY: Social History   Socioeconomic History  . Marital status: Widowed    Spouse name: Not on file  . Number of children: Not on file  . Years of education: Not on file  . Highest education level: Not on file  Occupational History  . Not on file  Social Needs  . Financial resource strain: Not on file  . Food insecurity:    Worry: Not on file    Inability: Not on file  . Transportation needs:    Medical: Not on file    Non-medical: Not on file  Tobacco Use  . Smoking status: Former Smoker    Types: Cigarettes    Last attempt to quit: 1970    Years since quitting: 50.2  . Smokeless tobacco: Never Used  Substance and Sexual Activity  . Alcohol use: No  . Drug use: No  . Sexual activity: Not on file  Lifestyle  . Physical activity:    Days per week: Not on file    Minutes per session: Not on file  . Stress: Not on file  Relationships  . Social connections:    Talks on phone: Not on file    Gets together: Not on file    Attends religious service: Not on file    Active member of club or  organization: Not on file    Attends meetings of clubs or organizations: Not on file    Relationship status: Not on file  . Intimate partner violence:    Fear of current or ex partner: Not on file    Emotionally abused: Not on file    Physically abused: Not on file    Forced sexual activity: Not on file  Other Topics Concern  . Not on file  Social History Narrative  . Not on file    Allergies  Allergen Reactions  . Codeine Nausea And Vomiting    Current Outpatient Medications  Medication Sig Dispense Refill  . acetaminophen (TYLENOL) 325 MG tablet Take 650 mg by mouth every 6 (six) hours as needed for moderate pain.    Marland Kitchen aspirin 81 MG tablet Take 81 mg by mouth daily.    . Biotin 5000 MCG TABS Take by mouth daily.    . cetirizine (ZYRTEC) 10 MG tablet Take 10 mg by mouth daily as needed for allergies.    .  Cholecalciferol (VITAMIN D) 2000 UNITS CAPS Take 2,000 Units by mouth daily.    Marland Kitchen erythromycin [facility]) ophthalmic ointment Use a small amount on your sutures 4 times a day for the next 2 weeks. Switch to Aquaphor ointment should allergy develop. 3.5 g 3  . Evening Primrose Oil 500 MG CAPS Take by mouth daily.    Marland Kitchen glucosamine-chondroitin 500-400 MG tablet Take 3 tablets by mouth daily.     Marland Kitchen ibuprofen (ADVIL,MOTRIN) 200 MG tablet Take 400 mg by mouth every 6 (six) hours as needed for moderate pain.    Marland Kitchen L-Lysine 500 MG TABS Take by mouth daily.    Marland Kitchen loratadine (CLARITIN) 10 MG tablet Take 10 mg by mouth daily as needed for allergies.    . Multiple Vitamin (MULTIVITAMIN WITH MINERALS) TABS tablet Take 1 tablet by mouth daily.    . traMADol (ULTRAM) 50 MG tablet Take 1 every 4-6 hours as needed for pain not controlled by Tylenol 6 tablet 0  . vitamin C (ASCORBIC ACID) 500 MG tablet Take 500 mg by mouth daily.     No current facility-administered medications for this visit.     ROS:   General:  No weight loss, Fever, chills  HEENT: No recent headaches, no nasal bleeding, no visual changes, no sore throat  Neurologic: No dizziness, blackouts, seizures. No recent symptoms of stroke or mini- stroke. No recent episodes of slurred speech, or temporary blindness.  Cardiac: No recent episodes of chest pain/pressure, no shortness of breath at rest.  No shortness of breath with exertion.  Denies history of atrial fibrillation or irregular heartbeat  Vascular: No history of rest pain in feet.  No history of claudication.  No history of non-healing ulcer, No history of DVT   Pulmonary: No home oxygen, no productive cough, no hemoptysis,  No asthma or wheezing  Musculoskeletal:  [ ]  Arthritis, [ ]  Low back pain,  [ ]  Joint pain  Hematologic:No history of hypercoagulable state.  No history of easy bleeding.  No history of anemia  Gastrointestinal: No hematochezia or melena,  No gastroesophageal  reflux, no trouble swallowing  Urinary: [ ]  chronic Kidney disease, [ ]  on HD - [ ]  MWF or [ ]  TTHS, [ ]  Burning with urination, [ ]  Frequent urination, [ ]  Difficulty urinating;   Skin: No rashes  Psychological: No history of anxiety,  No history of depression   Physical Examination  Vitals:   05/23/18 1418  BP: 132/86  Pulse: 77  Resp: 14  Temp: (!) 97.4 F (36.3 C)  TempSrc: Oral  SpO2: (!) 77%  Weight: 161 lb (73 kg)  Height: 5' 5.5" (1.664 m)    Body mass index is 26.38 kg/m.  General:  Alert and oriented, no acute distress Skin: No rash, scattered diffuse spider type varicosities lateral calves bilaterally anterolateral thigh bilaterally Extremity Pulses:  2+ radial, brachial, femoral, dorsalis pedis pulses bilaterally Musculoskeletal: No deformity or edema  Neurologic: Upper and lower extremity motor 5/5 and symmetric  DATA:  Patient had a venous reflux exam today which showed no evidence of superficial or deep vein reflux bilaterally.  I reviewed and interpreted the study.  ASSESSMENT: Spider type varicosities bilateral lower extremities.   PLAN: We offered the patient sclerotherapy injections of these today.  She understands that usually these is not approved by insurance.  We will try to get her scheduled in the near future.  We also discussed today that she can certainly have recurrences and that continue to wear compression stockings as her best way to prevent these from recurring over time.   Fabienne Bruns, MD [facility] Office: [phone] Pager: [phone]

## 2018-07-03 ENCOUNTER — Ambulatory Visit: Payer: Medicare Other

## 2018-08-15 ENCOUNTER — Other Ambulatory Visit: Payer: Self-pay

## 2018-08-15 ENCOUNTER — Ambulatory Visit
Admission: RE | Admit: 2018-08-15 | Discharge: 2018-08-15 | Disposition: A | Discharge status: Home / Self Care | Payer: Medicare Other | Source: Ambulatory Visit | Attending: Family Medicine | Admitting: Family Medicine

## 2018-08-15 DIAGNOSIS — Z1231 Encounter for screening mammogram for malignant neoplasm of breast: Secondary | ICD-10-CM

## 2018-12-21 ENCOUNTER — Other Ambulatory Visit: Payer: Self-pay

## 2018-12-21 ENCOUNTER — Encounter: Payer: Self-pay | Admitting: Allergy

## 2018-12-21 ENCOUNTER — Ambulatory Visit (INDEPENDENT_AMBULATORY_CARE_PROVIDER_SITE_OTHER): Payer: Medicare Other | Admitting: Allergy

## 2018-12-21 VITALS — BP 100/80 | HR 87 | Temp 98.1°F | Resp 16 | Ht 65.0 in | Wt 150.8 lb

## 2018-12-21 DIAGNOSIS — T7840XD Allergy, unspecified, subsequent encounter: Secondary | ICD-10-CM

## 2018-12-21 DIAGNOSIS — J3089 Other allergic rhinitis: Secondary | ICD-10-CM

## 2018-12-21 NOTE — Patient Instructions (Addendum)
Allergic reaction    - skin testing to shellfish panel is positive to oyster and equivocal to crab.  Will obtain shellfish IgE panel to determine if shrimp is negative.  If shrimp is negative then recommend in-office challenge to see if you are not allergic to crustaceans (shrimp, crab, lobster).   Oyster, scallops, clams, mussels are all mollusks and would absolutely avoid mollusk family.    - continue avoidance of al shellfish (until able to perform shrimp challenge)  - have access to self-injectable epinephrine Epipen 0.3mg  at all times  - follow emergency action plan in case of allergic reaction  Nasal drainage with post-nasal drip  - environmental allergy skin testing is positive to dust mites  - allergen avoidance measures discussed/handouts provided  - for control of nasal drainage recommend use of nasal antihistamine, Astelin 2 sprays each nostril 1-2 times a day as needed   Follow-up 4-6 months or sooner if needed

## 2018-12-21 NOTE — Progress Notes (Signed)
New Patient Note  RE: Marie Farrell MRN: [id number] DOB: 06/04/1945 Date of Office Visit: 12/21/2018  Referring provider: Catha GosselinLittle, Kevin, MD Primary care provider: Catha GosselinLittle, Kevin, MD  Chief Complaint: allergic reaction  History of present illness: Marie Farrell is a [age] y.o. female presenting today for consultation for allergic reaction.    [place]  She reports eating a dish with scallops, shrimp with vegetables.   About 1hr later she was walking around the beach and started itching on her chest and abdomen and then her scalp.  She states the itching was progressing.  She went back to their house and her son stated she needed to use an epipen but they did not have one.  She states she then noticed her voice started to change, eyes became puffy and then she states she felt like her throat was tightening.  She was afraid her throat was closing up.       EMS was called and she recalls getting IV benadryl en route.   In hospital she recalls getting solumedrol.  She was able to be discharged after observation.  She called her PCP who prescribed her an Epipen.  She has been avoiding shellfish since.      She does report 4679yrs ago at the beach she ate shrimp and developed mild case of hives.  She states however she had been eating shrimp this summer about once a month prior to this reaction without issue.    She reports constant nasal drainage for years with throat clearing.  She has not tried to take anything to help with this.    For the past 4 years she reports getting a episode of "asthmatic PNA" during winter typically.  She states she has a prescription for nebulizer.     Review of systems: Review of Systems  Constitutional: Negative for chills, fever and malaise/fatigue.  HENT: Negative for congestion, ear discharge, nosebleeds and sore throat.   Eyes: Negative for pain, discharge and redness.  Respiratory: Negative for cough, shortness  of breath and wheezing.   Cardiovascular: Negative for chest pain.  Gastrointestinal: Negative for abdominal pain, constipation, diarrhea, heartburn, nausea and vomiting.  Musculoskeletal: Negative for joint pain.  Skin: Positive for itching and rash.  Neurological: Negative for headaches.    All other systems negative unless noted above in HPI  Past medical history: Past Medical History:  Diagnosis Date  . Anemia    as a child none current   . Arthritis    knees, neck  . Asthma 2016-2017   winter - asthmatic bronchitis - resolved  . History of hiatal hernia    no treatment  . PONV (postoperative nausea and vomiting)    after surgery in 1970  . Varicose veins of leg with edema, bilateral     Past surgical history: Past Surgical History:  Procedure Laterality Date  . ARTERY BIOPSY Left 05/30/2017   Procedure: BIOPSY EYE LOWER;  Surgeon: Imagene RichesFowler, Amy M, MD;  [facility];  Service: Ophthalmology;  Laterality: Left;  . BROW LIFT Bilateral 05/30/2017   Procedure: BLEPHAROPLASTY UPPER EYELID W EXCESS SKIN;  Surgeon: Imagene RichesFowler, Amy M, MD;  [facility];  Service: Ophthalmology;  Laterality: Bilateral;  . BROW PTOSIS Bilateral 05/30/2017   Procedure: BROW PTOSIS;  Surgeon: Imagene RichesFowler, Amy M, MD;  [facility];  Service: Ophthalmology;  Laterality: Bilateral;  . COLONOSCOPY WITH PROPOFOL N/A 12/09/2014   Procedure:  COLONOSCOPY WITH PROPOFOL;  Surgeon: Garlan Fair, MD;  Location: [facility];  Service: Endoscopy;  Laterality: N/A;  . FOOT SURGERY  1970   bone spurs  . KNEE CARTILAGE SURGERY     arthroscopic  . TUBAL LIGATION     1988    Family history:  Family History  Problem Relation Age of Onset  . Diabetes Mother   . Cancer Mother   . Heart disease Father     Social history: Lives in a townhome without carpeting with gas heating and central cooling.  No pets in the home.  No concern for water damage, mildew or roaches in  the home.  She has a smoking history from 1968-1971 3-4 cigarettes daily only occasionally.  Medication List: Current Outpatient Medications  Medication Sig Dispense Refill  . Biotin 5000 MCG TABS Take by mouth daily.    . Cholecalciferol (VITAMIN D) 2000 UNITS CAPS Take 2,000 Units by mouth daily.    Marland Kitchen EPINEPHrine 0.3 mg/0.3 mL IJ SOAJ injection     . Evening Primrose Oil 500 MG CAPS Take by mouth daily.    Marland Kitchen glucosamine-chondroitin 500-400 MG tablet Take 3 tablets by mouth daily.     Marland Kitchen ibuprofen (ADVIL,MOTRIN) 200 MG tablet Take 400 mg by mouth every 6 (six) hours as needed for moderate pain.    Marland Kitchen L-Lysine 500 MG TABS Take by mouth daily.    . Multiple Vitamin (MULTIVITAMIN WITH MINERALS) TABS tablet Take 1 tablet by mouth daily.    . vitamin C (ASCORBIC ACID) 500 MG tablet Take 500 mg by mouth daily.     No current facility-administered medications for this visit.     Known medication allergies: Allergies  Allergen Reactions  . Codeine Nausea And Vomiting     Physical examination: Blood pressure 100/80, pulse 87, temperature 98.1 F (36.7 C), temperature source Temporal, resp. rate 16, height 5\' 5"  (1.651 m), weight 150 lb 12.8 oz (68.4 kg), SpO2 99 %.  General: Alert, interactive, in no acute distress. HEENT: PERRLA, TMs pearly gray, turbinates minimally edematous with clear discharge, post-pharynx non erythematous. Neck: Supple without lymphadenopathy. Lungs: Clear to auscultation without wheezing, rhonchi or rales. {no increased work of breathing. CV: Normal S1, S2 without murmurs. Abdomen: Nondistended, nontender. Skin: Warm and dry, without lesions or rashes. Extremities:  No clubbing, cyanosis or edema. Neuro:   Grossly intact.  Diagnositics/Labs:  Allergy testing: environmental allergy skin prick testing is positive to dust mites.  Shellfish panel skin prick testing is positive to oyster and equivocal to crab. Allergy testing results were read and interpreted by  provider, documented by clinical staff.   Assessment and plan:   Allergic reaction    - skin testing to shellfish panel is positive to oyster and equivocal to crab.  Will obtain shellfish IgE panel to determine if shrimp is negative.  If shrimp is negative then recommend in-office challenge to see if you are not allergic to crustaceans (shrimp, crab, lobster).   Oyster, scallops, clams, mussels are all mollusks and would absolutely avoid mollusk family.    - continue avoidance of al shellfish (until able to perform shrimp challenge)  - have access to self-injectable epinephrine Epipen 0.3mg  at all times  - follow emergency action plan in case of allergic reaction  Allergic rhinitis  - Nasal drainage with post-nasal drip  - environmental allergy skin testing is positive to dust mites  - allergen avoidance measures discussed/handouts provided  - for control of nasal drainage recommend use  of nasal antihistamine, Astelin 2 sprays each nostril 1-2 times a day as needed   Follow-up 4-6 months or sooner if needed  I appreciate the opportunity to take part in Marie Farrell's care. Please do not hesitate to contact me with questions.  Sincerely,   Margo Aye, MD Allergy/Immunology [facility]

## 2018-12-23 LAB — ALLERGEN PROFILE, SHELLFISH
Clam IgE: 0.42 kU/L — AB
F023-IgE Crab: 0.43 kU/L — AB
F080-IgE Lobster: 0.34 kU/L — AB
F290-IgE Oyster: <0.1 kU/L
Scallop IgE: <0.1 kU/L
Shrimp IgE: 1.59 kU/L — AB

## 2018-12-25 ENCOUNTER — Telehealth: Payer: Self-pay | Admitting: Allergy

## 2018-12-25 MED ORDER — AZELASTINE HCL 0.1 % NA SOLN: NASAL | 5 refills | Status: DC

## 2018-12-25 NOTE — Telephone Encounter (Signed)
Med sent.

## 2018-12-25 NOTE — Telephone Encounter (Signed)
Pt called and needs to have the Astelin called into [facility] [address] [phone]

## 2018-12-26 ENCOUNTER — Encounter: Payer: Self-pay | Admitting: *Deleted

## 2019-01-28 ENCOUNTER — Ambulatory Visit: Payer: Medicare Other

## 2019-02-11 ENCOUNTER — Ambulatory Visit: Payer: Medicare Other

## 2019-05-07 ENCOUNTER — Ambulatory Visit (INDEPENDENT_AMBULATORY_CARE_PROVIDER_SITE_OTHER): Payer: Self-pay

## 2019-05-07 ENCOUNTER — Other Ambulatory Visit: Payer: Self-pay

## 2019-05-07 DIAGNOSIS — I781 Nevus, non-neoplastic: Secondary | ICD-10-CM

## 2019-05-07 NOTE — Progress Notes (Signed)
Treated pt's widespread areas of bilateral spider and reticular veins with Asclera 1% administered with a 27g butterfly.  Patient received a total of 6 mL. Anticipate good results, though I discussed with pt the likelihood of another treatment, as it is not feasible to get all veins when they are this extensive in one treatment. Provided post procedure care instructions on both handout and verbally. Will follow PRN.   Photos: Yes.    Compression stockings applied: Yes.

## 2019-07-19 ENCOUNTER — Other Ambulatory Visit: Payer: Self-pay | Admitting: Family Medicine

## 2019-07-19 DIAGNOSIS — Z1231 Encounter for screening mammogram for malignant neoplasm of breast: Secondary | ICD-10-CM

## 2019-08-27 ENCOUNTER — Ambulatory Visit: Payer: Medicare PPO

## 2019-09-04 ENCOUNTER — Ambulatory Visit: Payer: Medicare PPO

## 2019-09-10 ENCOUNTER — Ambulatory Visit
Admission: RE | Admit: 2019-09-10 | Discharge: 2019-09-10 | Disposition: A | Discharge status: Home / Self Care | Payer: Medicare PPO | Source: Ambulatory Visit | Attending: Family Medicine | Admitting: Family Medicine

## 2019-09-10 ENCOUNTER — Other Ambulatory Visit: Payer: Self-pay

## 2019-09-10 DIAGNOSIS — Z1231 Encounter for screening mammogram for malignant neoplasm of breast: Secondary | ICD-10-CM

## 2019-10-14 DIAGNOSIS — J45909 Unspecified asthma, uncomplicated: Secondary | ICD-10-CM | POA: Diagnosis not present

## 2019-11-20 DIAGNOSIS — Z20822 Contact with and (suspected) exposure to covid-19: Secondary | ICD-10-CM | POA: Diagnosis not present

## 2019-12-11 DIAGNOSIS — L578 Other skin changes due to chronic exposure to nonionizing radiation: Secondary | ICD-10-CM | POA: Diagnosis not present

## 2019-12-11 DIAGNOSIS — L720 Epidermal cyst: Secondary | ICD-10-CM | POA: Diagnosis not present

## 2020-01-01 DIAGNOSIS — H2513 Age-related nuclear cataract, bilateral: Secondary | ICD-10-CM | POA: Diagnosis not present

## 2020-01-01 DIAGNOSIS — H5203 Hypermetropia, bilateral: Secondary | ICD-10-CM | POA: Diagnosis not present

## 2020-01-28 DIAGNOSIS — Z6825 Body mass index (BMI) 25.0-25.9, adult: Secondary | ICD-10-CM | POA: Diagnosis not present

## 2020-01-28 DIAGNOSIS — Z01419 Encounter for gynecological examination (general) (routine) without abnormal findings: Secondary | ICD-10-CM | POA: Diagnosis not present

## 2020-02-14 DIAGNOSIS — R000 Tachycardia, unspecified: Secondary | ICD-10-CM | POA: Diagnosis not present

## 2020-02-14 DIAGNOSIS — I493 Ventricular premature depolarization: Secondary | ICD-10-CM | POA: Diagnosis not present

## 2020-03-15 NOTE — Progress Notes (Signed)
Cardiology Office Note:    Date:  03/19/2020   ID:  [facility] [place], DOB Sep 16, 1945, MRN [id number]  PCP:  Catha Gosselin, MD  Cardiologist:  No primary care provider on file.   Referring MD: Daisy Floro, MD   Chief Complaint  Patient presents with  . Irregular Heart Beat    History of Present Illness:    Marie Farrell is a 75 y.o. female with a hx of PVC's/irregular heartbeat referred by Gynecologist.  The patient has been seen by me greater than 15 years ago.  She had 2 near syncopal episodes while exercising.  It was determined that she was developing hypoglycemia.  No recurrence of such symptoms.  More recently, she had a irregular heartbeat detected by Dr. Aline August during her GYN visit.  She subsequently saw Dr. Catha Gosselin for evaluation.  Dr. Tenny Craw saw her and Dr. Darrol Poke absence.  Evaluation was done with EKG and rhythm strips.  Dr. Charlott Rakes determination was that she was having PVCs.  She subsequently was to have cataract surgery by Dr. Rosalyn Gess but the surgery was canceled because of concerns about her heart.  Her only complaint is that 2 months ago she had a 5-minute episode where her heart was pounding very hard.  It was noticeable.  But then it resolved spontaneously.  She has had no recurrence of symptoms.  She continues exercise on a regular basis walking at least 3 days/week up to 3 miles.  Past Medical History:  Diagnosis Date  . Anemia    as a child none current   . Arthritis    knees, neck  . Asthma 2016-2017   winter - asthmatic bronchitis - resolved  . History of hiatal hernia    no treatment  . PONV (postoperative nausea and vomiting)    after surgery in 1970  . Varicose veins of leg with edema, bilateral     Past Surgical History:  Procedure Laterality Date  . ARTERY BIOPSY Left 05/30/2017   Procedure: BIOPSY EYE LOWER;  Surgeon: Imagene Riches, MD;  [facility];  Service: Ophthalmology;  Laterality: Left;  . BROW LIFT  Bilateral 05/30/2017   Procedure: BLEPHAROPLASTY UPPER EYELID W EXCESS SKIN;  Surgeon: Imagene Riches, MD;  [facility];  Service: Ophthalmology;  Laterality: Bilateral;  . BROW PTOSIS Bilateral 05/30/2017   Procedure: BROW PTOSIS;  Surgeon: Imagene Riches, MD;  [facility];  Service: Ophthalmology;  Laterality: Bilateral;  . COLONOSCOPY WITH PROPOFOL N/A 12/09/2014   Procedure: COLONOSCOPY WITH PROPOFOL;  Surgeon: Charolett Bumpers, MD;  Location: [facility];  Service: Endoscopy;  Laterality: N/A;  . FOOT SURGERY  1970   bone spurs  . KNEE CARTILAGE SURGERY     arthroscopic  . TUBAL LIGATION     1988    Current Medications: Current Meds  Medication Sig  . azelastine (ASTELIN) 0.1 % nasal spray Use 2 sprays per nostril 1-2 times daily as needed  . Biotin 5000 MCG TABS Take by mouth daily.  . Cholecalciferol (VITAMIN D) 2000 UNITS CAPS Take 2,000 Units by mouth daily.  Marland Kitchen EPINEPHrine 0.3 mg/0.3 mL IJ SOAJ injection   . Evening Primrose Oil 500 MG CAPS Take by mouth daily.  Marland Kitchen glucosamine-chondroitin 500-400 MG tablet Take 3 tablets by mouth daily.   Marland Kitchen ibuprofen (ADVIL,MOTRIN) 200 MG tablet Take 400 mg by mouth every 6 (six) hours as needed for moderate pain.  Marland Kitchen L-Lysine 500 MG TABS Take by  mouth daily.  . Multiple Vitamin (MULTIVITAMIN WITH MINERALS) TABS tablet Take 1 tablet by mouth daily.  . vitamin C (ASCORBIC ACID) 500 MG tablet Take 500 mg by mouth daily.     Allergies:   Codeine and Shellfish-derived products   Social History   Socioeconomic History  . Marital status: Widowed    Spouse name: Not on file  . Number of children: Not on file  . Years of education: Not on file  . Highest education level: Not on file  Occupational History  . Not on file  Tobacco Use  . Smoking status: Former Smoker    Types: Cigarettes    Quit date: 1970    Years since quitting: 52.0  . Smokeless tobacco: Never Used  Vaping Use  . Vaping Use: Never used   Substance and Sexual Activity  . Alcohol use: No  . Drug use: No  . Sexual activity: Not on file  Other Topics Concern  . Not on file  Social History Narrative  . Not on file   Social Determinants of Health   Financial Resource Strain: Not on file  Food Insecurity: Not on file  Transportation Needs: Not on file  Physical Activity: Not on file  Stress: Not on file  Social Connections: Not on file     Family History: The patient's family history includes Cancer in her mother; Diabetes in her mother; Heart disease in her father.  ROS:   Please see the history of present illness.    She denies syncope, diabetes, has had no recurrent hypoglycemic episodes, and she does note that both her younger brother and her father had atrial fibrillation.  All other systems reviewed and are negative.  EKGs/Labs/Other Studies Reviewed:    The following studies were reviewed today: No cardiac imaging or other cardiac data other than abnormal EKG and rhythm strips from [place] [facility].  EKG:  EKG performed today demonstrates sinus rhythm, left atrial abnormality, possible borderline right atrial abnormality, relatively low voltage but overall normal in appearance.  Recent Labs: No results found for requested labs within last 8760 hours.  Recent Lipid Panel No results found for: CHOL, TRIG, HDL, CHOLHDL, VLDL, LDLCALC, LDLDIRECT  Physical Exam:    VS:  BP 116/72   Pulse 83   Ht 5' 5.5" (1.664 m)   Wt 154 lb 9.6 oz (70.1 kg)   SpO2 96%   BMI 25.34 kg/m     Wt Readings from Last 3 Encounters:  03/19/20 154 lb 9.6 oz (70.1 kg)  12/21/18 150 lb 12.8 oz (68.4 kg)  05/23/18 161 lb (73 kg)     GEN: Normal weight. No acute distress HEENT: Normal NECK: No JVD. LYMPHATICS: No lymphadenopathy CARDIAC: No murmur. RRR no gallop, or edema. VASCULAR:  Normal Pulses. No bruits. RESPIRATORY:  Clear to auscultation without rales, wheezing or rhonchi  ABDOMEN: Soft, non-tender,  non-distended, No pulsatile mass, MUSCULOSKELETAL: No deformity  SKIN: Warm and dry NEUROLOGIC:  Alert and oriented x 3 PSYCHIATRIC:  Normal affect   ASSESSMENT:    1. PVC's (premature ventricular contractions)   2. Preoperative clearance   3. Varicose veins of leg with swelling, bilateral   4. Educated about COVID-19 virus infection   5. Hyperlipidemia LDL goal <100    PLAN:    In order of problems listed above:  1. PVCs suggested by primary care.  Given family history of A. fib we need to exclude intermittent/asymptomatic AF.  30-day monitor.  She does not  have any symptoms that would suggest high risk for cataract surgery and is cleared from that standpoint. 2. Cleared to proceed with cataract surgery by Dr. Prudencio Burly. 3. Did not discuss 4. Vaccinated and practicing social distancing. 5. May need to be treated if we find any evidence of vascular disease.  Current LDL is 132.   Medication Adjustments/Labs and Tests Ordered: Current medicines are reviewed at length with the patient today.  Concerns regarding medicines are outlined above.  Orders Placed This Encounter  Procedures  . Cardiac event monitor  . EKG 12-Lead   No orders of the defined types were placed in this encounter.   Patient Instructions  Medication Instructions:  Your physician recommends that you continue on your current medications as directed. Please refer to the Current Medication list given to you today.  *If you need a refill on your cardiac medications before your next appointment, please call your pharmacy*   Lab Work: None If you have labs (blood work) drawn today and your tests are completely normal, you will receive your results only by: Marland Kitchen MyChart Message (if you have MyChart) OR . A paper copy in the mail If you have any lab test that is abnormal or we need to change your treatment, we will call you to review the results.   Testing/Procedures: Your physician has recommended that you wear an  event monitor. Event monitors are medical devices that record the heart's electrical activity. Doctors most often [place] these monitors to diagnose arrhythmias. Arrhythmias are problems with the speed or rhythm of the heartbeat. The monitor is a small, portable device. You can wear one while you do your normal daily activities. This is usually used to diagnose what is causing palpitations/syncope (passing out).    Follow-Up: At [facility], you and your health needs are our priority.  As part of our continuing mission to provide you with exceptional heart care, we have created designated Provider Care Teams.  These Care Teams include your primary Cardiologist (physician) and Advanced Practice Providers (APPs -  Physician Assistants and Nurse Practitioners) who all work together to provide you with the care you need, when you need it.  We recommend signing up for the patient portal called "MyChart".  Sign up information is provided on this After Visit Summary.  MyChart is used to connect with patients for Virtual Visits (Telemedicine).  Patients are able to view lab/test results, encounter notes, upcoming appointments, etc.  Non-urgent messages can be sent to your provider as well.   To learn more about what you can do with MyChart, go to [email or web address].    Your next appointment:   As needed  The format for your next appointment:   In Person  Provider:   You may see Dr. Daneen Schick or one of the following Advanced Practice Providers on your designated Care Team:    Truitt Merle, NP  Cecilie Kicks, NP  Kathyrn Drown, NP    Other Instructions  [employer] Cardiac Event Monitor Instructions Your physician has requested you wear your cardiac event monitor for 30 days. [employer] may call or text to confirm a shipping address. The monitor will be sent to a land address via UPS. [employer] will not ship a monitor to a PO BOX. It typically takes 3-5 days to receive your monitor after  it has been enrolled. [employer] will assist with USPS tracking if your package is delayed. The telephone number for [employer] is [phone]. Once you have received your monitor, please review the enclosed  instructions. Instruction tutorials can also be viewed under help and settings on the enclosed cell phone. Your monitor has already been registered assigning a specific monitor serial # to you.  Applying the monitor Remove cell phone from case and turn it on. The cell phone works as IT consultant and needs to be within [employer] of you at all times. The cell phone will need to be charged on a daily basis. We recommend you plug the cell phone into the enclosed charger at your bedside table every night.  Monitor batteries: You will receive two monitor batteries labelled #1 and #2. These are your recorders. Plug battery #2 onto the second connection on the enclosed charger. Keep one battery on the charger at all times. This will keep the monitor battery deactivated. It will also keep it fully charged for when you need to switch your monitor batteries. A small light will be blinking on the battery emblem when it is charging. The light on the battery emblem will remain on when the battery is fully charged.  Open package of a Monitor strip. Insert battery #1 into black hood on strip and gently squeeze monitor battery onto connection as indicated in instruction booklet. Set aside while preparing skin.  Choose location for your strip, vertical or horizontal, as indicated in the instruction booklet. Shave to remove all hair from location. There cannot be any lotions, oils, powders, or colognes on skin where monitor is to be applied. Wipe skin clean with enclosed Saline wipe. Dry skin completely.  Peel paper labeled #1 off the back of the Monitor strip exposing the adhesive. Place the monitor on the chest in the vertical or horizontal position shown in the instruction booklet. One arrow on  the monitor strip must be pointing upward. Carefully remove paper labeled #2, attaching remainder of strip to your skin. Try not to create any folds or wrinkles in the strip as you apply it.  Firmly press and release the circle in the center of the monitor battery. You will hear a small beep. This is turning the monitor battery on. The heart emblem on the monitor battery will light up every 5 seconds if the monitor battery in turned on and connected to the patient securely. Do not push and hold the circle down as this turns the monitor battery off. The cell phone will locate the monitor battery. A screen will appear on the cell phone checking the connection of your monitor strip. This may read poor connection initially but change to good connection within the next minute. Once your monitor accepts the connection you will hear a series of 3 beeps followed by a climbing crescendo of beeps. A screen will appear on the cell phone showing the two monitor strip placement options. Touch the picture that demonstrates where you applied the monitor strip.  Your monitor strip and battery are waterproof. You are able to shower, bathe, or swim with the monitor on. They just ask you do not submerge deeper than 3 feet underwater. We recommend removing the monitor if you are swimming in a lake, river, or ocean.  Your monitor battery will need to be switched to a fully charged monitor battery approximately once a week. The cell phone will alert you of an action which needs to be made.  On the cell phone, tap for details to reveal connection status, monitor battery status, and cell phone battery status. The green dots indicates your monitor is in good status. A red dot indicates there is  something that needs your attention.  To record a symptom, click the circle on the monitor battery. In 30-60 seconds a list of symptoms will appear on the cell phone. Select your symptom and tap save. Your monitor will record  a sustained or significant arrhythmia regardless of you clicking the button. Some patients do not feel the heart rhythm irregularities. [employer] will notify us of any serious or critical events.  Refer to instruction booklet for instructions on switching batteries, changing strips, the Do not disturb or Pause features, or any additional questions.  Call [employer] at [phone], to confirm your monitor is transmitting and record your baseline. They will answer any questions you may have regarding the monitor instructions at that time.  Returning the monitor to [employer] Place all equipment back into blue box. Peel off strip of paper to expose adhesive and close box securely. There is a prepaid UPS shipping label on this box. Drop in a UPS drop box, or at a UPS facility like [employer]. You may also contact [employer] to arrange UPS to pick up monitor package at your home.      Signed, Lesleigh Noe, MD  03/19/2020 3:56 PM    [facility]

## 2020-03-19 ENCOUNTER — Other Ambulatory Visit: Payer: Self-pay

## 2020-03-19 ENCOUNTER — Ambulatory Visit: Payer: Medicare PPO | Admitting: Interventional Cardiology

## 2020-03-19 ENCOUNTER — Encounter: Payer: Self-pay | Admitting: *Deleted

## 2020-03-19 ENCOUNTER — Encounter: Payer: Self-pay | Admitting: Interventional Cardiology

## 2020-03-19 VITALS — BP 116/72 | HR 83 | Ht 65.5 in | Wt 154.6 lb

## 2020-03-19 DIAGNOSIS — Z85820 Personal history of malignant melanoma of skin: Secondary | ICD-10-CM | POA: Diagnosis not present

## 2020-03-19 DIAGNOSIS — R000 Tachycardia, unspecified: Secondary | ICD-10-CM

## 2020-03-19 DIAGNOSIS — M8580 Other specified disorders of bone density and structure, unspecified site: Secondary | ICD-10-CM | POA: Diagnosis not present

## 2020-03-19 DIAGNOSIS — E559 Vitamin D deficiency, unspecified: Secondary | ICD-10-CM | POA: Diagnosis not present

## 2020-03-19 DIAGNOSIS — I493 Ventricular premature depolarization: Secondary | ICD-10-CM | POA: Diagnosis not present

## 2020-03-19 DIAGNOSIS — Z7189 Other specified counseling: Secondary | ICD-10-CM

## 2020-03-19 DIAGNOSIS — Z01810 Encounter for preprocedural cardiovascular examination: Secondary | ICD-10-CM | POA: Diagnosis not present

## 2020-03-19 DIAGNOSIS — M899 Disorder of bone, unspecified: Secondary | ICD-10-CM | POA: Diagnosis not present

## 2020-03-19 DIAGNOSIS — Z0000 Encounter for general adult medical examination without abnormal findings: Secondary | ICD-10-CM | POA: Diagnosis not present

## 2020-03-19 DIAGNOSIS — E785 Hyperlipidemia, unspecified: Secondary | ICD-10-CM | POA: Diagnosis not present

## 2020-03-19 DIAGNOSIS — I83893 Varicose veins of bilateral lower extremities with other complications: Secondary | ICD-10-CM | POA: Diagnosis not present

## 2020-03-19 DIAGNOSIS — Z79899 Other long term (current) drug therapy: Secondary | ICD-10-CM | POA: Diagnosis not present

## 2020-03-19 DIAGNOSIS — Z01818 Encounter for other preprocedural examination: Secondary | ICD-10-CM

## 2020-03-19 DIAGNOSIS — Z833 Family history of diabetes mellitus: Secondary | ICD-10-CM | POA: Diagnosis not present

## 2020-03-19 NOTE — Progress Notes (Signed)
Patient ID: Marie Farrell, female   DOB: 10/08/1945, 75 y.o.   MRN: [id number] Patient enrolled for [employer] to ship a 30 day cardiac event monitor to her home.

## 2020-03-19 NOTE — Patient Instructions (Addendum)
Medication Instructions:  Your physician recommends that you continue on your current medications as directed. Please refer to the Current Medication list given to you today.  *If you need a refill on your cardiac medications before your next appointment, please call your pharmacy*   Lab Work: None If you have labs (blood work) drawn today and your tests are completely normal, you will receive your results only by: . MyChart Message (if you have MyChart) OR . A paper copy in the mail If you have any lab test that is abnormal or we need to change your treatment, we will call you to review the results.   Testing/Procedures: Your physician has recommended that you wear an event monitor. Event monitors are medical devices that record the heart's electrical activity. Doctors most often us these monitors to diagnose arrhythmias. Arrhythmias are problems with the speed or rhythm of the heartbeat. The monitor is a small, portable device. You can wear one while you do your normal daily activities. This is usually used to diagnose what is causing palpitations/syncope (passing out).    Follow-Up: At [facility], you and your health needs are our priority.  As part of our continuing mission to provide you with exceptional heart care, we have created designated Provider Care Teams.  These Care Teams include your primary Cardiologist (physician) and Advanced Practice Providers (APPs -  Physician Assistants and Nurse Practitioners) who all work together to provide you with the care you need, when you need it.  We recommend signing up for the patient portal called "MyChart".  Sign up information is provided on this After Visit Summary.  MyChart is used to connect with patients for Virtual Visits (Telemedicine).  Patients are able to view lab/test results, encounter notes, upcoming appointments, etc.  Non-urgent messages can be sent to your provider as well.   To learn more about what you can do with  MyChart, go to [email or web address].    Your next appointment:   As needed  The format for your next appointment:   In Person  Provider:   You may see Dr. Henry Smith or one of the following Advanced Practice Providers on your designated Care Team:    Lori Gerhardt, NP  Laura Ingold, NP  Jill McDaniel, NP    Other Instructions  [employer] Cardiac Event Monitor Instructions Your physician has requested you wear your cardiac event monitor for 30 days. [employer] may call or text to confirm a shipping address. The monitor will be sent to a land address via UPS. [employer] will not ship a monitor to a PO BOX. It typically takes 3-5 days to receive your monitor after it has been enrolled. [employer] will assist with USPS tracking if your package is delayed. The telephone number for [employer] is [phone]. Once you have received your monitor, please review the enclosed instructions. Instruction tutorials can also be viewed under help and settings on the enclosed cell phone. Your monitor has already been registered assigning a specific monitor serial # to you.  Applying the monitor Remove cell phone from case and turn it on. The cell phone works as your transmitter and needs to be within 10 feet of you at all times. The cell phone will need to be charged on a daily basis. We recommend you plug the cell phone into the enclosed charger at your bedside table every night.  Monitor batteries: You will receive two monitor batteries labelled #1 and #2. These are your recorders. Plug battery #2 onto the second connection   on the enclosed charger. Keep one battery on the charger at all times. This will keep the monitor battery deactivated. It will also keep it fully charged for when you need to switch your monitor batteries. A small light will be blinking on the battery emblem when it is charging. The light on the battery emblem will remain on when the battery is fully  charged.  Open package of a Monitor strip. Insert battery #1 into black hood on strip and gently squeeze monitor battery onto connection as indicated in instruction booklet. Set aside while preparing skin.  Choose location for your strip, vertical or horizontal, as indicated in the instruction booklet. Shave to remove all hair from location. There cannot be any lotions, oils, powders, or colognes on skin where monitor is to be applied. Wipe skin clean with enclosed Saline wipe. Dry skin completely.  Peel paper labeled #1 off the back of the Monitor strip exposing the adhesive. Place the monitor on the chest in the vertical or horizontal position shown in the instruction booklet. One arrow on the monitor strip must be pointing upward. Carefully remove paper labeled #2, attaching remainder of strip to your skin. Try not to create any folds or wrinkles in the strip as you apply it.  Firmly press and release the circle in the center of the monitor battery. You will hear a small beep. This is turning the monitor battery on. The heart emblem on the monitor battery will light up every 5 seconds if the monitor battery in turned on and connected to the patient securely. Do not push and hold the circle down as this turns the monitor battery off. The cell phone will locate the monitor battery. A screen will appear on the cell phone checking the connection of your monitor strip. This may read poor connection initially but change to good connection within the next minute. Once your monitor accepts the connection you will hear a series of 3 beeps followed by a climbing crescendo of beeps. A screen will appear on the cell phone showing the two monitor strip placement options. Touch the picture that demonstrates where you applied the monitor strip.  Your monitor strip and battery are waterproof. You are able to shower, bathe, or swim with the monitor on. They just ask you do not submerge deeper than 3 feet  underwater. We recommend removing the monitor if you are swimming in a lake, river, or ocean.  Your monitor battery will need to be switched to a fully charged monitor battery approximately once a week. The cell phone will alert you of an action which needs to be made.  On the cell phone, tap for details to reveal connection status, monitor battery status, and cell phone battery status. The green dots indicates your monitor is in good status. A red dot indicates there is something that needs your attention.  To record a symptom, click the circle on the monitor battery. In 30-60 seconds a list of symptoms will appear on the cell phone. Select your symptom and tap save. Your monitor will record a sustained or significant arrhythmia regardless of you clicking the button. Some patients do not feel the heart rhythm irregularities. [employer] will notify us of any serious or critical events.  Refer to instruction booklet for instructions on switching batteries, changing strips, the Do not disturb or Pause features, or any additional questions.  Call [employer] at [phone], to confirm your monitor is transmitting and record your baseline. They will answer any questions you   may have regarding the monitor instructions at that time.  Returning the monitor to [employer] Place all equipment back into blue box. Peel off strip of paper to expose adhesive and close box securely. There is a prepaid UPS shipping label on this box. Drop in a UPS drop box, or at a UPS facility like [employer]. You may also contact [employer] to arrange UPS to pick up monitor package at your home.  

## 2020-03-25 DIAGNOSIS — M899 Disorder of bone, unspecified: Secondary | ICD-10-CM | POA: Diagnosis not present

## 2020-03-25 DIAGNOSIS — Z0000 Encounter for general adult medical examination without abnormal findings: Secondary | ICD-10-CM | POA: Diagnosis not present

## 2020-03-25 DIAGNOSIS — M8580 Other specified disorders of bone density and structure, unspecified site: Secondary | ICD-10-CM | POA: Diagnosis not present

## 2020-03-25 DIAGNOSIS — E559 Vitamin D deficiency, unspecified: Secondary | ICD-10-CM | POA: Diagnosis not present

## 2020-03-25 DIAGNOSIS — Z79899 Other long term (current) drug therapy: Secondary | ICD-10-CM | POA: Diagnosis not present

## 2020-03-25 DIAGNOSIS — E785 Hyperlipidemia, unspecified: Secondary | ICD-10-CM | POA: Diagnosis not present

## 2020-03-25 DIAGNOSIS — I493 Ventricular premature depolarization: Secondary | ICD-10-CM | POA: Diagnosis not present

## 2020-03-25 DIAGNOSIS — I8393 Asymptomatic varicose veins of bilateral lower extremities: Secondary | ICD-10-CM | POA: Diagnosis not present

## 2020-03-25 DIAGNOSIS — Z85820 Personal history of malignant melanoma of skin: Secondary | ICD-10-CM | POA: Diagnosis not present

## 2020-03-26 ENCOUNTER — Ambulatory Visit (INDEPENDENT_AMBULATORY_CARE_PROVIDER_SITE_OTHER): Payer: Medicare PPO

## 2020-03-26 DIAGNOSIS — I493 Ventricular premature depolarization: Secondary | ICD-10-CM | POA: Diagnosis not present

## 2020-03-31 DIAGNOSIS — H2511 Age-related nuclear cataract, right eye: Secondary | ICD-10-CM | POA: Diagnosis not present

## 2020-03-31 DIAGNOSIS — H25811 Combined forms of age-related cataract, right eye: Secondary | ICD-10-CM | POA: Diagnosis not present

## 2020-05-21 DIAGNOSIS — M1711 Unilateral primary osteoarthritis, right knee: Secondary | ICD-10-CM | POA: Diagnosis not present

## 2020-05-21 DIAGNOSIS — M25562 Pain in left knee: Secondary | ICD-10-CM | POA: Diagnosis not present

## 2020-06-16 DIAGNOSIS — H52202 Unspecified astigmatism, left eye: Secondary | ICD-10-CM | POA: Diagnosis not present

## 2020-06-16 DIAGNOSIS — H2512 Age-related nuclear cataract, left eye: Secondary | ICD-10-CM | POA: Diagnosis not present

## 2020-06-16 DIAGNOSIS — H25812 Combined forms of age-related cataract, left eye: Secondary | ICD-10-CM | POA: Diagnosis not present

## 2020-06-23 DIAGNOSIS — M899 Disorder of bone, unspecified: Secondary | ICD-10-CM | POA: Diagnosis not present

## 2020-06-23 DIAGNOSIS — I8393 Asymptomatic varicose veins of bilateral lower extremities: Secondary | ICD-10-CM | POA: Diagnosis not present

## 2020-06-23 DIAGNOSIS — I493 Ventricular premature depolarization: Secondary | ICD-10-CM | POA: Diagnosis not present

## 2020-06-23 DIAGNOSIS — Z79899 Other long term (current) drug therapy: Secondary | ICD-10-CM | POA: Diagnosis not present

## 2020-06-23 DIAGNOSIS — Z0000 Encounter for general adult medical examination without abnormal findings: Secondary | ICD-10-CM | POA: Diagnosis not present

## 2020-06-23 DIAGNOSIS — E559 Vitamin D deficiency, unspecified: Secondary | ICD-10-CM | POA: Diagnosis not present

## 2020-06-23 DIAGNOSIS — Z85820 Personal history of malignant melanoma of skin: Secondary | ICD-10-CM | POA: Diagnosis not present

## 2020-06-23 DIAGNOSIS — E785 Hyperlipidemia, unspecified: Secondary | ICD-10-CM | POA: Diagnosis not present

## 2020-06-23 DIAGNOSIS — M8580 Other specified disorders of bone density and structure, unspecified site: Secondary | ICD-10-CM | POA: Diagnosis not present

## 2020-06-24 DIAGNOSIS — M1711 Unilateral primary osteoarthritis, right knee: Secondary | ICD-10-CM | POA: Diagnosis not present

## 2020-06-30 DIAGNOSIS — M1711 Unilateral primary osteoarthritis, right knee: Secondary | ICD-10-CM | POA: Diagnosis not present

## 2020-07-14 DIAGNOSIS — B009 Herpesviral infection, unspecified: Secondary | ICD-10-CM | POA: Diagnosis not present

## 2020-07-20 DIAGNOSIS — Z20822 Contact with and (suspected) exposure to covid-19: Secondary | ICD-10-CM | POA: Diagnosis not present

## 2020-07-28 ENCOUNTER — Other Ambulatory Visit: Payer: Self-pay | Admitting: Family Medicine

## 2020-07-28 DIAGNOSIS — Z1231 Encounter for screening mammogram for malignant neoplasm of breast: Secondary | ICD-10-CM

## 2020-08-05 DIAGNOSIS — D225 Melanocytic nevi of trunk: Secondary | ICD-10-CM | POA: Diagnosis not present

## 2020-08-05 DIAGNOSIS — D1801 Hemangioma of skin and subcutaneous tissue: Secondary | ICD-10-CM | POA: Diagnosis not present

## 2020-08-05 DIAGNOSIS — D2361 Other benign neoplasm of skin of right upper limb, including shoulder: Secondary | ICD-10-CM | POA: Diagnosis not present

## 2020-08-05 DIAGNOSIS — L905 Scar conditions and fibrosis of skin: Secondary | ICD-10-CM | POA: Diagnosis not present

## 2020-08-05 DIAGNOSIS — L570 Actinic keratosis: Secondary | ICD-10-CM | POA: Diagnosis not present

## 2020-08-05 DIAGNOSIS — Z85820 Personal history of malignant melanoma of skin: Secondary | ICD-10-CM | POA: Diagnosis not present

## 2020-08-05 DIAGNOSIS — L738 Other specified follicular disorders: Secondary | ICD-10-CM | POA: Diagnosis not present

## 2020-08-05 DIAGNOSIS — L821 Other seborrheic keratosis: Secondary | ICD-10-CM | POA: Diagnosis not present

## 2020-08-05 DIAGNOSIS — L814 Other melanin hyperpigmentation: Secondary | ICD-10-CM | POA: Diagnosis not present

## 2020-08-06 ENCOUNTER — Other Ambulatory Visit: Payer: Self-pay

## 2020-08-06 ENCOUNTER — Ambulatory Visit (INDEPENDENT_AMBULATORY_CARE_PROVIDER_SITE_OTHER): Payer: Medicare PPO | Admitting: Family Medicine

## 2020-08-06 ENCOUNTER — Ambulatory Visit: Payer: Self-pay | Admitting: Family Medicine

## 2020-08-06 ENCOUNTER — Encounter: Payer: Self-pay | Admitting: Family Medicine

## 2020-08-06 VITALS — BP 124/84 | HR 95 | Temp 96.4°F | Resp 16 | Ht 66.0 in | Wt 157.8 lb

## 2020-08-06 DIAGNOSIS — T7800XA Anaphylactic reaction due to unspecified food, initial encounter: Secondary | ICD-10-CM | POA: Diagnosis not present

## 2020-08-06 DIAGNOSIS — T781XXD Other adverse food reactions, not elsewhere classified, subsequent encounter: Secondary | ICD-10-CM

## 2020-08-06 DIAGNOSIS — T7840XD Allergy, unspecified, subsequent encounter: Secondary | ICD-10-CM

## 2020-08-06 MED ORDER — EPINEPHRINE 0.3 MG/0.3ML IJ SOAJ: 0.3000 mg | INTRAMUSCULAR | 1 refills | Status: AC | PRN

## 2020-08-06 NOTE — Patient Instructions (Addendum)
Allergic rhinitis Continue allergen avoidance measures directed toward dust mite as listed below Continue an over-the-counter antihistamine once a day as needed for runny nose or itch Continue azelastine 2 sprays in each nostril twice a day as needed for runny nose  Food allergy Your skin testing was positive to crab. At this time, continue to avoid shellfish and mollusks. In case of an allergic reaction, take Benadryl 50 mg every 4 hours, and if life-threatening symptoms occur, inject with EpiPen 0.3 mg. We have ordered labs to help [place] evaluate your food allergies. We will call you when the results become available  Call the clinic if this treatment plan is not working well for you  Follow up in 1 year or sooner if needed.   Control of Dust Mite Allergen Dust mites play a major role in allergic asthma and rhinitis. They occur in environments with high humidity wherever human skin is found. Dust mites absorb humidity from the atmosphere (ie, they do not drink) and feed on organic matter (including shed human and animal skin). Dust mites are a microscopic type of insect that you cannot see with the naked eye. High levels of dust mites have been detected from mattresses, pillows, carpets, upholstered furniture, bed covers, clothes, soft toys and any woven material. The principal allergen of the dust mite is found in its feces. A gram of dust may contain 1,000 mites and 250,000 fecal particles. Mite antigen is easily measured in the air during house cleaning activities. Dust mites do not bite and do not cause harm to humans, other than by triggering allergies/asthma.  Ways to decrease your exposure to dust mites in your home:  1. Encase mattresses, box springs and pillows with a mite-impermeable barrier or cover  2. Wash sheets, blankets and drapes weekly in hot water (130 F) with detergent and dry them in a dryer on the hot setting.  3. Have the room cleaned frequently with a vacuum cleaner and  a damp dust-mop. For carpeting or rugs, vacuuming with a vacuum cleaner equipped with a high-efficiency particulate air (HEPA) filter. The dust mite allergic individual should not be in a room which is being cleaned and should wait 1 hour after cleaning before going into the room.  4. Do not sleep on upholstered furniture (eg, couches).  5. If possible removing carpeting, upholstered furniture and drapery from the home is ideal. Horizontal blinds should be eliminated in the rooms where the person spends the most time (bedroom, study, television room). Washable vinyl, roller-type shades are optimal.  6. Remove all non-washable stuffed toys from the bedroom. Wash stuffed toys weekly like sheets and blankets above.  7. Reduce indoor humidity to less than 50%. Inexpensive humidity monitors can be purchased at most hardware stores. Do not use a humidifier as can make the problem worse and are not recommended.

## 2020-08-06 NOTE — Progress Notes (Signed)
shellfish  [address] [place] [address] Dept: [phone]  FOLLOW UP NOTE  Patient ID: [facility] Lendell Caprice, female    DOB: 12-Jul-1945  Age: 75 y.o. MRN: [id number] Date of Office Visit: 08/06/2020  Assessment  Chief Complaint: Allergic Reaction (Avoiding shellfish, scallops - off of all anti histamines for a week  )  HPI Marie Farrell is a 75 year old female who presents to the clinic for follow-up visit.  She was last seen in this clinic on 12/21/2018 by Dr. Delorse Lek for evaluation of allergic rhinitis and food allergy to shellfish.  At today's visit, she reports allergic rhinitis has been moderately well controlled with symptoms including clear rhinorrhea and occasional sneezing which occurs mostly in the morning.  She continues an over-the-counter antihistamine as needed and occasional saline nasal rinses as needed with relief of symptoms.  She continues to avoid shellfish with no accidental ingestion or EpiPen use since her last visit to this clinic.  She is interested in updating food allergy testing at this time.  Her current medications are listed in the chart.   Drug Allergies:  Allergies  Allergen Reactions  . Other Nausea And Vomiting and Anaphylaxis  . Codeine Nausea And Vomiting  . Shellfish-Derived Products     Physical Exam: BP 124/84   Pulse 95   Temp (!) 96.4 F (35.8 C)   Resp 16   Ht 5\' 6"  (1.676 m)   Wt 157 lb 12.8 oz (71.6 kg)   SpO2 96%   BMI 25.47 kg/m    Physical Exam Vitals reviewed.  HENT:     Head: Normocephalic and atraumatic.     Right Ear: Tympanic membrane normal.     Left Ear: Tympanic membrane normal.     Nose:     Comments: Bilateral nares normal.  Pharynx normal.  Ears normal.  Eyes normal.    Mouth/Throat:     Pharynx: Oropharynx is clear.  Eyes:     Conjunctiva/sclera: Conjunctivae normal.  Cardiovascular:     Rate and Rhythm: Normal rate and regular rhythm.     Heart sounds: Normal heart sounds. No murmur  heard.   Pulmonary:     Effort: Pulmonary effort is normal.     Breath sounds: Normal breath sounds.     Comments: Lungs clear to auscultation Musculoskeletal:        General: Normal range of motion.     Cervical back: Normal range of motion and neck supple.  Skin:    General: Skin is warm and dry.  Neurological:     Mental Status: She is alert and oriented to person, place, and time.  Psychiatric:        Mood and Affect: Mood normal.        Behavior: Behavior normal.        Thought Content: Thought content normal.        Judgment: Judgment normal.     Diagnostics: Percutaneous skin testing positive to crab and negative to remaining shellfish panel with adequate controls.  Assessment and Plan: 1. Allergic reaction, subsequent encounter   2. Allergy with anaphylaxis due to food     Meds ordered this encounter  Medications  . EPINEPHrine 0.3 mg/0.3 mL IJ SOAJ injection    Sig: Inject 0.3 mg into the muscle as needed for anaphylaxis.    Dispense:  1 each    Refill:  1    Patient Instructions  Allergic rhinitis Continue allergen avoidance measures directed toward dust mite as listed  below Continue an over-the-counter antihistamine once a day as needed for runny nose or itch Continue azelastine 2 sprays in each nostril twice a day as needed for runny nose  Food allergy Your skin testing was positive to crab. At this time, continue to avoid shellfish and mollusks. In case of an allergic reaction, take Benadryl 50 mg every 4 hours, and if life-threatening symptoms occur, inject with EpiPen 0.3 mg. We have ordered labs to help [place] evaluate your food allergies. We will call you when the results become available  Call the clinic if this treatment plan is not working well for you  Follow up in 1 year or sooner if needed.   Return in about 1 year (around 08/06/2021), or if symptoms worsen or fail to improve.    Thank you for the opportunity to care for this patient.  Please  do not hesitate to contact me with questions.  Thermon Leyland, FNP [facility]

## 2020-08-12 DIAGNOSIS — R509 Fever, unspecified: Secondary | ICD-10-CM | POA: Diagnosis not present

## 2020-08-12 DIAGNOSIS — R5383 Other fatigue: Secondary | ICD-10-CM | POA: Diagnosis not present

## 2020-08-12 DIAGNOSIS — Z6825 Body mass index (BMI) 25.0-25.9, adult: Secondary | ICD-10-CM | POA: Diagnosis not present

## 2020-08-12 DIAGNOSIS — R059 Cough, unspecified: Secondary | ICD-10-CM | POA: Diagnosis not present

## 2020-08-13 ENCOUNTER — Other Ambulatory Visit: Payer: Self-pay

## 2020-08-13 DIAGNOSIS — Z20822 Contact with and (suspected) exposure to covid-19: Secondary | ICD-10-CM | POA: Diagnosis not present

## 2020-08-13 DIAGNOSIS — R051 Acute cough: Secondary | ICD-10-CM | POA: Diagnosis not present

## 2020-08-13 LAB — ALLERGEN PROFILE, SHELLFISH
Clam IgE: 0.25 kU/L — AB
F023-IgE Crab: 0.18 kU/L — AB
F080-IgE Lobster: 0.18 kU/L — AB
F290-IgE Oyster: <0.1 kU/L
Scallop IgE: <0.1 kU/L
Shrimp IgE: 0.96 kU/L — AB

## 2020-08-13 MED ORDER — AZELASTINE HCL 0.1 % NA SOLN: NASAL | 5 refills | Status: DC

## 2020-08-18 NOTE — Progress Notes (Signed)
Can you please let this patient know that her levels of clam, crab, and lobster are still slightly elevated. Please let her know that her shrimp is moderately elevated. All the labs show lower numbers than last year. If she is interested, she may schedule a shrimp challenge in the clinic. Thank you

## 2020-09-24 ENCOUNTER — Ambulatory Visit
Admission: RE | Admit: 2020-09-24 | Discharge: 2020-09-24 | Disposition: A | Discharge status: Home / Self Care | Payer: Medicare PPO | Source: Ambulatory Visit | Attending: Family Medicine | Admitting: Family Medicine

## 2020-09-24 ENCOUNTER — Other Ambulatory Visit: Payer: Self-pay

## 2020-09-24 DIAGNOSIS — Z1231 Encounter for screening mammogram for malignant neoplasm of breast: Secondary | ICD-10-CM

## 2020-11-23 DIAGNOSIS — L570 Actinic keratosis: Secondary | ICD-10-CM | POA: Diagnosis not present

## 2020-11-23 DIAGNOSIS — L821 Other seborrheic keratosis: Secondary | ICD-10-CM | POA: Diagnosis not present

## 2020-11-23 DIAGNOSIS — Z419 Encounter for procedure for purposes other than remedying health state, unspecified: Secondary | ICD-10-CM | POA: Diagnosis not present

## 2020-11-23 DIAGNOSIS — D225 Melanocytic nevi of trunk: Secondary | ICD-10-CM | POA: Diagnosis not present

## 2020-11-23 DIAGNOSIS — L814 Other melanin hyperpigmentation: Secondary | ICD-10-CM | POA: Diagnosis not present

## 2020-11-23 DIAGNOSIS — B0089 Other herpesviral infection: Secondary | ICD-10-CM | POA: Diagnosis not present

## 2021-01-09 DIAGNOSIS — Z23 Encounter for immunization: Secondary | ICD-10-CM | POA: Diagnosis not present

## 2021-01-11 DIAGNOSIS — J069 Acute upper respiratory infection, unspecified: Secondary | ICD-10-CM | POA: Diagnosis not present

## 2021-01-28 DIAGNOSIS — Z6826 Body mass index (BMI) 26.0-26.9, adult: Secondary | ICD-10-CM | POA: Diagnosis not present

## 2021-01-28 DIAGNOSIS — Z01419 Encounter for gynecological examination (general) (routine) without abnormal findings: Secondary | ICD-10-CM | POA: Diagnosis not present

## 2021-04-08 DIAGNOSIS — K449 Diaphragmatic hernia without obstruction or gangrene: Secondary | ICD-10-CM | POA: Diagnosis not present

## 2021-04-08 DIAGNOSIS — E7800 Pure hypercholesterolemia, unspecified: Secondary | ICD-10-CM | POA: Diagnosis not present

## 2021-04-08 DIAGNOSIS — Z85820 Personal history of malignant melanoma of skin: Secondary | ICD-10-CM | POA: Diagnosis not present

## 2021-04-08 DIAGNOSIS — J309 Allergic rhinitis, unspecified: Secondary | ICD-10-CM | POA: Diagnosis not present

## 2021-04-08 DIAGNOSIS — I8390 Asymptomatic varicose veins of unspecified lower extremity: Secondary | ICD-10-CM | POA: Diagnosis not present

## 2021-04-08 DIAGNOSIS — M1990 Unspecified osteoarthritis, unspecified site: Secondary | ICD-10-CM | POA: Diagnosis not present

## 2021-04-08 DIAGNOSIS — R002 Palpitations: Secondary | ICD-10-CM | POA: Diagnosis not present

## 2021-04-08 DIAGNOSIS — M8580 Other specified disorders of bone density and structure, unspecified site: Secondary | ICD-10-CM | POA: Diagnosis not present

## 2021-04-08 DIAGNOSIS — F418 Other specified anxiety disorders: Secondary | ICD-10-CM | POA: Diagnosis not present

## 2021-05-10 DIAGNOSIS — B0089 Other herpesviral infection: Secondary | ICD-10-CM | POA: Diagnosis not present

## 2021-06-07 DIAGNOSIS — M179 Osteoarthritis of knee, unspecified: Secondary | ICD-10-CM | POA: Insufficient documentation

## 2021-06-08 DIAGNOSIS — M170 Bilateral primary osteoarthritis of knee: Secondary | ICD-10-CM | POA: Diagnosis not present

## 2021-07-22 DIAGNOSIS — L821 Other seborrheic keratosis: Secondary | ICD-10-CM | POA: Diagnosis not present

## 2021-07-22 DIAGNOSIS — D225 Melanocytic nevi of trunk: Secondary | ICD-10-CM | POA: Diagnosis not present

## 2021-07-22 DIAGNOSIS — Z85820 Personal history of malignant melanoma of skin: Secondary | ICD-10-CM | POA: Diagnosis not present

## 2021-07-22 DIAGNOSIS — L814 Other melanin hyperpigmentation: Secondary | ICD-10-CM | POA: Diagnosis not present

## 2021-07-29 DIAGNOSIS — H04123 Dry eye syndrome of bilateral lacrimal glands: Secondary | ICD-10-CM | POA: Diagnosis not present

## 2021-07-29 DIAGNOSIS — Z961 Presence of intraocular lens: Secondary | ICD-10-CM | POA: Diagnosis not present

## 2021-07-29 DIAGNOSIS — H524 Presbyopia: Secondary | ICD-10-CM | POA: Diagnosis not present

## 2021-07-29 DIAGNOSIS — H26493 Other secondary cataract, bilateral: Secondary | ICD-10-CM | POA: Diagnosis not present

## 2021-08-27 ENCOUNTER — Other Ambulatory Visit: Payer: Self-pay | Admitting: Internal Medicine

## 2021-08-27 DIAGNOSIS — Z1231 Encounter for screening mammogram for malignant neoplasm of breast: Secondary | ICD-10-CM

## 2021-09-06 DIAGNOSIS — R002 Palpitations: Secondary | ICD-10-CM | POA: Diagnosis not present

## 2021-09-06 DIAGNOSIS — F418 Other specified anxiety disorders: Secondary | ICD-10-CM | POA: Diagnosis not present

## 2021-09-06 DIAGNOSIS — E785 Hyperlipidemia, unspecified: Secondary | ICD-10-CM | POA: Diagnosis not present

## 2021-09-13 DIAGNOSIS — R82998 Other abnormal findings in urine: Secondary | ICD-10-CM | POA: Diagnosis not present

## 2021-09-13 DIAGNOSIS — E7800 Pure hypercholesterolemia, unspecified: Secondary | ICD-10-CM | POA: Diagnosis not present

## 2021-09-13 DIAGNOSIS — M1990 Unspecified osteoarthritis, unspecified site: Secondary | ICD-10-CM | POA: Diagnosis not present

## 2021-09-13 DIAGNOSIS — F418 Other specified anxiety disorders: Secondary | ICD-10-CM | POA: Diagnosis not present

## 2021-09-13 DIAGNOSIS — R002 Palpitations: Secondary | ICD-10-CM | POA: Diagnosis not present

## 2021-09-13 DIAGNOSIS — R059 Cough, unspecified: Secondary | ICD-10-CM | POA: Diagnosis not present

## 2021-09-13 DIAGNOSIS — K449 Diaphragmatic hernia without obstruction or gangrene: Secondary | ICD-10-CM | POA: Diagnosis not present

## 2021-09-13 DIAGNOSIS — M8580 Other specified disorders of bone density and structure, unspecified site: Secondary | ICD-10-CM | POA: Diagnosis not present

## 2021-09-13 DIAGNOSIS — Z0000 Encounter for general adult medical examination without abnormal findings: Secondary | ICD-10-CM | POA: Diagnosis not present

## 2021-09-13 DIAGNOSIS — Z1331 Encounter for screening for depression: Secondary | ICD-10-CM | POA: Diagnosis not present

## 2021-10-07 ENCOUNTER — Observation Stay (HOSPITAL_BASED_OUTPATIENT_CLINIC_OR_DEPARTMENT_OTHER): Payer: Medicare PPO

## 2021-10-07 ENCOUNTER — Emergency Department (HOSPITAL_COMMUNITY): Payer: Medicare PPO

## 2021-10-07 ENCOUNTER — Other Ambulatory Visit: Payer: Self-pay

## 2021-10-07 ENCOUNTER — Observation Stay (HOSPITAL_COMMUNITY)
Admission: EM | Admit: 2021-10-07 | Discharge: 2021-10-08 | Disposition: A | Discharge status: Home / Self Care | Payer: Medicare PPO | Attending: Internal Medicine | Admitting: Internal Medicine

## 2021-10-07 DIAGNOSIS — J45909 Unspecified asthma, uncomplicated: Secondary | ICD-10-CM | POA: Insufficient documentation

## 2021-10-07 DIAGNOSIS — I493 Ventricular premature depolarization: Secondary | ICD-10-CM | POA: Diagnosis not present

## 2021-10-07 DIAGNOSIS — R059 Cough, unspecified: Secondary | ICD-10-CM | POA: Diagnosis not present

## 2021-10-07 DIAGNOSIS — R079 Chest pain, unspecified: Secondary | ICD-10-CM

## 2021-10-07 DIAGNOSIS — I351 Nonrheumatic aortic (valve) insufficiency: Secondary | ICD-10-CM | POA: Insufficient documentation

## 2021-10-07 DIAGNOSIS — R9431 Abnormal electrocardiogram [ECG] [EKG]: Secondary | ICD-10-CM | POA: Diagnosis not present

## 2021-10-07 DIAGNOSIS — R55 Syncope and collapse: Secondary | ICD-10-CM | POA: Insufficient documentation

## 2021-10-07 DIAGNOSIS — R42 Dizziness and giddiness: Secondary | ICD-10-CM

## 2021-10-07 DIAGNOSIS — Z87891 Personal history of nicotine dependence: Secondary | ICD-10-CM | POA: Insufficient documentation

## 2021-10-07 DIAGNOSIS — Z79899 Other long term (current) drug therapy: Secondary | ICD-10-CM | POA: Insufficient documentation

## 2021-10-07 DIAGNOSIS — E875 Hyperkalemia: Secondary | ICD-10-CM | POA: Diagnosis not present

## 2021-10-07 DIAGNOSIS — R0789 Other chest pain: Secondary | ICD-10-CM | POA: Diagnosis not present

## 2021-10-07 LAB — CBC
HCT: 44 % (ref 36.0–46.0)
Hemoglobin: 14.4 g/dL (ref 12.0–15.0)
MCH: 32.1 pg (ref 26.0–34.0)
MCHC: 32.7 g/dL (ref 30.0–36.0)
MCV: 98.2 fL (ref 80.0–100.0)
Platelets: 195 10*3/uL (ref 150–400)
RBC: 4.48 MIL/uL (ref 3.87–5.11)
RDW: 13.8 % (ref 11.5–15.5)
WBC: 6.2 10*3/uL (ref 4.0–10.5)
nRBC: 0 % (ref 0.0–0.2)

## 2021-10-07 LAB — ECHOCARDIOGRAM COMPLETE
Area-P 1/2: 3.31 cm2
Height: 65.5 in
P 1/2 time: 391 msec
S' Lateral: 2.8 cm
Weight: 2606.72 oz

## 2021-10-07 LAB — BASIC METABOLIC PANEL
Anion gap: 7 (ref 5–15)
BUN: 17 mg/dL (ref 8–23)
CO2: 23 mmol/L (ref 22–32)
Calcium: 9.5 mg/dL (ref 8.9–10.3)
Chloride: 109 mmol/L (ref 98–111)
Creatinine, Ser: 0.72 mg/dL (ref 0.44–1.00)
GFR, Estimated: >60 mL/min (ref >60)
Glucose, Bld: 98 mg/dL (ref 70–99)
Potassium: 5.6 mmol/L — ABNORMAL HIGH (ref 3.5–5.1)
Sodium: 139 mmol/L (ref 135–145)

## 2021-10-07 LAB — TROPONIN I (HIGH SENSITIVITY)
Troponin I (High Sensitivity): 3 ng/L (ref <18)
Troponin I (High Sensitivity): 4 ng/L (ref <18)

## 2021-10-07 LAB — D-DIMER, QUANTITATIVE: D-Dimer, Quant: 0.31 ug/mL-FEU (ref 0.00–0.50)

## 2021-10-07 LAB — MAGNESIUM: Magnesium: 2.2 mg/dL (ref 1.7–2.4)

## 2021-10-07 LAB — TSH: TSH: 1.141 u[IU]/mL (ref 0.350–4.500)

## 2021-10-07 MED ORDER — ONDANSETRON HCL 4 MG PO TABS: 4.0000 mg | ORAL_TABLET | Freq: Four times a day (QID) | ORAL | Status: DC | PRN

## 2021-10-07 MED ORDER — ALBUTEROL SULFATE (2.5 MG/3ML) 0.083% IN NEBU: 2.5000 mg | INHALATION_SOLUTION | Freq: Four times a day (QID) | RESPIRATORY_TRACT | Status: DC | PRN

## 2021-10-07 MED ORDER — ACETAMINOPHEN 650 MG RE SUPP: 650.0000 mg | Freq: Four times a day (QID) | RECTAL | Status: DC | PRN

## 2021-10-07 MED ORDER — ONDANSETRON HCL 4 MG/2ML IJ SOLN: 4.0000 mg | Freq: Four times a day (QID) | INTRAMUSCULAR | Status: DC | PRN

## 2021-10-07 MED ORDER — METOPROLOL TARTRATE 50 MG PO TABS
50.0000 mg | ORAL_TABLET | Freq: Once | ORAL | Status: AC | PRN
  Administered 2021-10-08: 50 mg via ORAL
  Filled 2021-10-07: qty 1

## 2021-10-07 MED ORDER — SODIUM CHLORIDE 0.9 % IV SOLN: INTRAVENOUS | Status: DC

## 2021-10-07 MED ORDER — SODIUM CHLORIDE 0.9% FLUSH
3.0000 mL | Freq: Two times a day (BID) | INTRAVENOUS | Status: DC
  Administered 2021-10-07: 3 mL via INTRAVENOUS

## 2021-10-07 MED ORDER — ENOXAPARIN SODIUM 40 MG/0.4ML IJ SOSY
40.0000 mg | PREFILLED_SYRINGE | INTRAMUSCULAR | Status: DC
Start: 2021-10-07 — End: 2021-10-08
  Filled 2021-10-07 (×2): qty 0.4

## 2021-10-07 MED ORDER — SODIUM CHLORIDE 0.9 % IV SOLN: INTRAVENOUS | Status: AC

## 2021-10-07 MED ORDER — ACETAMINOPHEN 325 MG PO TABS: 650.0000 mg | ORAL_TABLET | Freq: Four times a day (QID) | ORAL | Status: DC | PRN

## 2021-10-07 NOTE — ED Notes (Signed)
This RN attempted to get IV access at this time without success IV team consult placed. Provider at bedside to update patient and family on the plan of care at this time.

## 2021-10-07 NOTE — ED Triage Notes (Signed)
LKW 1000 AM. Pt reports being at [employer] when she had new onset dizziness and weakness BIL extremity.  Pt also endurses 6/10 chest pain.

## 2021-10-07 NOTE — ED Provider Notes (Signed)
[facility] EMERGENCY DEPARTMENT Provider Note   CSN: [id number] Arrival date & time: 10/07/21  1051     History  Chief Complaint  Patient presents with   Dizziness   Chest Pain      Marie Farrell is a 76 y.o. female. Previous smoker with pmh anemia, PVCs presents with presyncopal episode associated with chest pain.  Patient was sitting today at the [employer] when she began to feel unwell.  This happened around 10:30 AM.  She said she felt very nauseous like she was going to vomit, lightheaded like she was going to faint, was diaphoretic and began to feel chest pain.  She described the chest pain as pressure-like nonradiating substernal.  She did not endorse any reflux-like symptoms or abdominal pain.  No pain rating down the arms or up her neck.  Her sister was there who witnessed this episode and said she looked very unwell and cool and clammy during this episode.  She had eaten and drank before this episode and had no stressful episodes happening.  She has never had this happen before and this episode lasted approximately an hour and a half and eventually resolved without intervention.  She was able to ambulate steadily and had no focal weakness, paresthesias, loss sensation, slurred speech or facial droop.  The last time she had a stress test was about 15 years prior.  She had a Holter monitor performed quite recently which showed PVCs.  She denies any recent illness, fevers, cough, vomiting, diarrhea, shortness of breath.  She now feels back to baseline and has no current complaints.   Dizziness Associated symptoms: chest pain   Chest Pain Associated symptoms: dizziness        Home Medications Prior to Admission medications   Medication Sig Start Date End Date Taking? Authorizing Provider  azelastine (ASTELIN) 0.1 % nasal spray Use 2 sprays per nostril 1-2 times daily as needed 08/13/20   Ambs, Norvel Richards, FNP  Biotin 5000 MCG TABS Take by mouth daily.     [provider]  Cholecalciferol (VITAMIN D) 2000 UNITS CAPS Take 2,000 Units by mouth daily.    [provider]  EPINEPHrine 0.3 mg/0.3 mL IJ SOAJ injection Inject 0.3 mg into the muscle as needed for anaphylaxis. 08/06/20   Hetty Blend, FNP  Evening Primrose Oil 500 MG CAPS Take by mouth daily.    [provider]  glucosamine-chondroitin 500-400 MG tablet Take 3 tablets by mouth daily.     [provider]  ibuprofen (ADVIL,MOTRIN) 200 MG tablet Take 400 mg by mouth every 6 (six) hours as needed for moderate pain.    [provider]  L-Lysine 500 MG TABS Take by mouth daily.    [provider]  Multiple Vitamin (MULTIVITAMIN WITH MINERALS) TABS tablet Take 1 tablet by mouth daily.    [provider]  vitamin C (ASCORBIC ACID) 500 MG tablet Take 500 mg by mouth daily.    [provider]      Allergies    Other, Codeine, and Shellfish-derived products    Review of Systems   Review of Systems  Cardiovascular:  Positive for chest pain.  Neurological:  Positive for dizziness.    Physical Exam Updated Vital Signs BP (!) 149/86   Pulse 70   Temp (!) 97.5 F (36.4 C)   Resp 20   SpO2 99%  Physical Exam Constitutional: Alert and oriented. Well appearing and in no distress. Eyes: Conjunctivae are  normal. ENT      Head: Normocephalic and atraumatic.      Nose: No congestion.      Mouth/Throat: Mucous membranes are moist.      Neck: No stridor. Cardiovascular: S1, S2,  Normal and symmetric distal pulses are present in all extremities.Warm and well perfused. Respiratory: Normal respiratory effort. Breath sounds are normal. Gastrointestinal: Soft and nontender. No palpable mass. Musculoskeletal: Normal range of motion in all extremities.      Right lower leg: No tenderness or edema.      Left lower leg: No tenderness or edema. Neurologic: Normal speech and language.  5 out of 5 strength bilateral upper and lower  extremities.  No sensation deficit.  CN II through XII grossly intact.  Normal finger-to-nose and heel-to-shin.  Normal stable ambulatory gait.  No gross focal neurologic deficits are appreciated. Skin: Skin is warm, dry and intact. No rash noted. Psychiatric: Mood and affect are normal. Speech and behavior are normal.   ED Results / Procedures / Treatments   Labs (all labs ordered are listed, but only abnormal results are displayed) Labs Reviewed  BASIC METABOLIC PANEL - Abnormal; Notable for the following components:      Result Value   Potassium 5.6 (*)    All other components within normal limits  CBC  MAGNESIUM  D-DIMER, QUANTITATIVE  TSH  TROPONIN I (HIGH SENSITIVITY)  TROPONIN I (HIGH SENSITIVITY)    EKG EKG Interpretation  Date/Time:  Thursday October 07 2021 11:12:29 EDT Ventricular Rate:  64 PR Interval:  158 QRS Duration: 78 QT Interval:  412 QTC Calculation: 425 R Axis:   23 Text Interpretation: Normal sinus rhythm Septal infarct , age undetermined Abnormal ECG When compared with ECG of 11-Jul-2007 08:42, PREVIOUS ECG IS PRESENT Normal sinus rhythm T wave flattening avL no contiguous or reciprocal changes Confirmed by Vivien Rossetti ([id number]) on 10/07/2021 12:50:40 PM  Radiology DG Chest 1 View  Result Date: 10/07/2021 CLINICAL DATA:  Dizziness with chest pain and cough. EXAM: CHEST  1 VIEW COMPARISON:  Radiographs 04/09/2014. FINDINGS: 1126 hours. The heart size and mediastinal contours are normal. The lungs are clear. There is no pleural effusion or pneumothorax. No acute osseous findings are identified. IMPRESSION: Stable chest.  No active cardiopulmonary process. Electronically Signed   By: Carey Bullocks M.D.   On: 10/07/2021 11:32    Procedures Procedures  Remains on constant cardiac monitoring, normal sinus rhythm.  Medications Ordered in ED Medications  0.9 %  sodium chloride infusion (has no administration in time range)  enoxaparin (LOVENOX) injection 40  mg (has no administration in time range)  sodium chloride flush (NS) 0.9 % injection 3 mL (has no administration in time range)  acetaminophen (TYLENOL) tablet 650 mg (has no administration in time range)    Or  acetaminophen (TYLENOL) suppository 650 mg (has no administration in time range)  ondansetron (ZOFRAN) tablet 4 mg (has no administration in time range)    Or  ondansetron (ZOFRAN) injection 4 mg (has no administration in time range)  albuterol (PROVENTIL) (2.5 MG/3ML) 0.083% nebulizer solution 2.5 mg (has no administration in time range)    ED Course/ Medical Decision Making/ A&P Clinical Course as of 10/07/21 1529  Thu Oct 07, 2021  1439 Labs generally unremarkable with slightly elevated potassium 5.6 but that is hemolyzed.  Her initial high sensitive troponin 3, repeat 4.  Due to concerning story and heart score of 5, page hospitalist for continued observation, echocardiogram and possible inpatient stress  test. [VB]  1502 Spoke with hospitalist who requested discussion with cardiology for consult. Admission orders for observation placed by hospitalist. [VB]  1528 Spoke with cardiology team who will service consult and patient will be admitted to hospitalist. [VB]    Clinical Course User Index [VB] Elgie Congo, MD                           Medical Decision Making Regarding the patient's chest pain, their HEART score is 5 and overall have an EKG which is reassuring but does have concerning story for ACS although pain now resolved.  Initial hs-troponin 3 reasurring, will repeat and obtain repeat EKG.CXR  with no evidence of pneumonia, pneumothorax, and pulmonary edema. I do not think PE because patient without hypoxia, history PE/DVT, no shortness of breath, no tachycardia. I do not think aortic dissection as the patient is well-appearing, does not have ripping/tearing pain and equally has no pulse or neurologic deficits with no mediastinal widening on CXR.  Patient has a  heart score of 5 with a concerning story for presyncope and atypical ACS and possible underlying paroxysmal arrhythmia.  Has not had a stress test for the past 15 years.  Do think patient would benefit from continued cardiac monitoring, ECHO and inpatient stress test. Will consult hospitalist for admission.  Discussion of Management with other Physicians, QHP or Appropriate Source: Hospitalist and cardiology  Independent Interpretation of Studies:  RAD :CXR with no mediastinal widening, no pneumothorax, no pneumonia  External Records Reviewed: n/a Escalation of Care, Consideration of Admission/Observation/Transfer: Admit Social determinants that significantly affected care: none Prescription drug(s) considered but not prescribed: none Diagnostic tests considered but not performed: Considered CTA dissection but unlikely with well appearance, no neurologic or pulse deficit History obtained from other sources: sister         Amount and/or Complexity of Data Reviewed Labs: ordered. Decision-making details documented in ED Course. Radiology: independent interpretation performed. ECG/medicine tests:  Decision-making details documented in ED Course.  Risk Decision regarding hospitalization.      Final Clinical Impression(s) / ED Diagnoses Final diagnoses:  Chest pain, unspecified type  Pre-syncope    Rx / DC Orders ED Discharge Orders     None         Elgie Congo, MD 10/07/21 1529

## 2021-10-07 NOTE — ED Notes (Signed)
IV team at bedside 

## 2021-10-07 NOTE — Progress Notes (Signed)
  Echocardiogram 2D Echocardiogram has been performed.  Gerda Diss 10/07/2021, 5:46 PM

## 2021-10-07 NOTE — ED Provider Triage Note (Signed)
Emergency Medicine Provider Triage Evaluation Note  Marie Farrell , a 76 y.o. female  was evaluated in triage.  Pt complains of Pt complains of lightheadedness, weakness and chest pain since 930.  Patient reports she was at the [facility] with her grandson when the symptoms began all of a sudden.  Patient states that when she woke up this morning she felt fine.  Patient states she has a history of chest pain which has been attributed to GERD in the past.  Patient states the chest pain is centralized, nonradiating, not worse with exertion.  Patient states that she is not short of breath, denies nausea or vomiting.  The patient denies any unilateral weakness or numbness, word finding issues.  Review of Systems  Positive:  Negative:   Physical Exam  BP (!) 147/93   Pulse 67   Temp 98.5 F (36.9 C) (Oral)   Resp 18   SpO2 100%  Gen:   Awake, no distress   Resp:  Normal effort  MSK:   Moves extremities without difficulty  Other:  No focal neurologic deficits on examination.  Lung sounds clear.  No lower extremity swelling.  Medical Decision Making  Medically screening exam initiated at 11:12 AM.  Appropriate orders placed.  Marie Farrell was informed that the remainder of the evaluation will be completed by another provider, this initial triage assessment does not replace that evaluation, and the importance of remaining in the ED until their evaluation is complete.     Al Decant, PA-C 10/07/21 1113

## 2021-10-07 NOTE — H&P (Signed)
History and Physical    PatientPura Farrell [facility] JHE:[id number] DOB: [birth year] DOA: 10/07/2021 DOS: the patient was seen and examined on 10/07/2021 PCP: Catha Gosselin, MD  Patient coming from: Private vehicle  Chief Complaint:  Chief Complaint  Patient presents with   Dizziness   Chest Pain   HPI: Marie Farrell is a 76 y.o. female with medical history significant of hiatal hernia who presented with complaints of dizziness and chest pain.  [place] had [place] [employer] [place] with her grandson.  She was sitting down at the time when she acutely felt dizzy as though she could pass out, but never lost consciousness.  She subsequently got very nauseated, broke out into a sweat, and had severe substernal chest pressure.  Symptoms lasted approximately hour and a half prior to self resolving.  She had never experienced symptoms this.    On admission into the emergency department patient was noted to be afebrile with blood pressure 147/84 to 154/79, and all other vital signs maintained.  Labs significant for potassium 5.6.  Chest x-ray was otherwise noted to be stable.  EKG noted no acute ischemic changes.  TRH called to admit.  Cardiology has been consulted to evaluate the patient.   Review of Systems: As mentioned in the history of present illness. All other systems reviewed and are negative. Past Medical History:  Diagnosis Date   Anemia    as a child none current    Arthritis    knees, neck   Asthma 2016-2017   winter - asthmatic bronchitis - resolved   History of hiatal hernia    no treatment   PONV (postoperative nausea and vomiting)    after surgery in 1970   Varicose veins of leg with edema, bilateral    Past Surgical History:  Procedure Laterality Date   ADENOIDECTOMY     ARTERY BIOPSY Left 05/30/2017   Procedure: BIOPSY EYE LOWER;  Surgeon: Imagene Riches, MD;  Location: [facility];  Service: Ophthalmology;  Laterality: Left;   BROW LIFT  Bilateral 05/30/2017   Procedure: BLEPHAROPLASTY UPPER EYELID W EXCESS SKIN;  Surgeon: Imagene Riches, MD;  Location: [facility];  Service: Ophthalmology;  Laterality: Bilateral;   BROW PTOSIS Bilateral 05/30/2017   Procedure: BROW PTOSIS;  Surgeon: Imagene Riches, MD;  Location: [facility];  Service: Ophthalmology;  Laterality: Bilateral;   COLONOSCOPY WITH PROPOFOL N/A 12/09/2014   Procedure: COLONOSCOPY WITH PROPOFOL;  Surgeon: Charolett Bumpers, MD;  Location: [facility];  Service: Endoscopy;  Laterality: N/A;   FOOT SURGERY  1970   bone spurs   KNEE CARTILAGE SURGERY     arthroscopic   TUBAL LIGATION     1988   Social History:  reports that she quit smoking about 53 years ago. Her smoking use included cigarettes. She has never used smokeless tobacco. She reports that she does not drink alcohol and does not use drugs.  Allergies  Allergen Reactions   Other Nausea And Vomiting and Anaphylaxis   Codeine Nausea And Vomiting   Shellfish-Derived Products     Family History  Problem Relation Age of Onset   Diabetes Mother    Cancer Mother    Heart disease Father     Prior to Admission medications   Medication Sig Start Date End Date Taking? Authorizing Provider  azelastine (ASTELIN) 0.1 % nasal spray Use 2 sprays per nostril 1-2 times daily as needed 08/13/20   Ambs, Norvel Richards, FNP  Biotin 5000 MCG TABS Take by mouth daily.    [provider]  Cholecalciferol (VITAMIN D) 2000 UNITS CAPS Take 2,000 Units by mouth daily.    [provider]  EPINEPHrine 0.3 mg/0.3 mL IJ SOAJ injection Inject 0.3 mg into the muscle as needed for anaphylaxis. 08/06/20   Hetty Blend, FNP  Evening Primrose Oil 500 MG CAPS Take by mouth daily.    [provider]  glucosamine-chondroitin 500-400 MG tablet Take 3 tablets by mouth daily.     [provider]  ibuprofen (ADVIL,MOTRIN) 200 MG tablet Take 400 mg by mouth every 6 (six) hours as needed for moderate pain.     [provider]  L-Lysine 500 MG TABS Take by mouth daily.    [provider]  Multiple Vitamin (MULTIVITAMIN WITH MINERALS) TABS tablet Take 1 tablet by mouth daily.    [provider]  vitamin C (ASCORBIC ACID) 500 MG tablet Take 500 mg by mouth daily.    [provider]    Physical Exam: Vitals:   10/07/21 1312 10/07/21 1315 10/07/21 1330 10/07/21 1438  BP: (!) 148/78 (!) 147/84 (!) 154/79 (!) 151/83  Pulse: 64 (!) 58 76 67  Resp: 18 19 20 19   Temp:    (!) 97.5 F (36.4 C)  TempSrc:      SpO2: 98% 99% 96% 98%   Exam  Constitutional: Elderly female currently in no acute distress Eyes: PERRL, lids and conjunctivae normal ENMT: Mucous membranes are moist. Posterior pharynx clear of any exudate or lesions.  Neck: normal, supple, no JVD Respiratory: clear to auscultation bilaterally, no wheezing, no crackles. Normal respiratory effort. No accessory muscle use.  Cardiovascular: Regular rate and rhythm, no murmurs / rubs / gallops.  Nonpitting lower extremity edema. 2+ pedal pulses. No carotid bruits.  Abdomen: no tenderness, no masses palpated.  Bowel sounds positive.  Musculoskeletal: no clubbing / cyanosis. No joint deformity upper and lower extremities. Good ROM, no contractures. Normal muscle tone.  Skin: no rashes, lesions, ulcers. No induration Neurologic: CN 2-12 grossly intact. Sensation intact, DTR normal. Strength 5/5 in all 4.  Psychiatric: Normal judgment and insight. Alert and oriented x 3. Normal mood.   Data Reviewed:  EKG reveals normal sinus rhythm at 64 bpm.  Reviewed labs imaging and pertinent records as noted above in HPI  Assessment and Plan: Chest pain Acute.  Patient presents after having episode of dizziness, nausea, and substernal chest pressure.  High-sensitivity troponins negative x2.  EKG without significant ischemic changes.  Symptoms self resolved after hour and a half. -Admit to a telemetry bed -Check TSH,  D-dimer, -Check orthostatic vital signs -Check echocardiogram -Cardiology consulted for possible need of stress testing.  We will follow-up for any further recommendations  Hyperkalemia Acute.  Initial potassium elevated at 5.6.   -Normal saline IV fluids at 75 mL/h overnight. -Recheck sodium levels in a.m.   Advance Care Planning:   Code Status: Full Code   Consults: Cardiology  Family Communication: No family requested be updated at this time  Severity of Illness: The appropriate patient status for this patient is OBSERVATION. Observation status is judged to be reasonable and necessary in order to provide the required intensity of service to ensure the patient's safety. The patient's presenting symptoms, physical exam findings, and initial radiographic and laboratory data in the context of their medical condition is felt to place them at decreased risk for further clinical deterioration. Furthermore, it is anticipated that the patient will  be medically stable for discharge from the hospital within 2 midnights of admission.   Author: Clydie Braun, MD 10/07/2021 3:05 PM  For on call review [email or web address].

## 2021-10-07 NOTE — Consult Note (Signed)
Cardiology Consultation:   Patient ID: [name] [facility] MRN: [id number]; DOB: 1946/01/31  Admit date: 10/07/2021 Date of Consult: 10/07/2021  PCP:  Hulan Fess, MD   [facility] Providers Cardiologist:  Sinclair Grooms, MD     Patient Profile:   Marie Farrell is a 76 y.o. female with a hx of PVCs who is being seen 10/07/2021 for the evaluation of chest pain at the request of Dr. Tamala Julian.  History of Present Illness:   Marie Farrell is a 76 yo female with PMH of PVCs. Was seen by Dr. Tamala Julian in the office 03/2020 as a referral for PVCs from her OB/GYN. She wore an outpatient 30d monitor with no significant arrhythmias noted.   Overall she is in good health.  She does not take any medications on a regular basis.  Sees a PCP annually for her physicals.  She lives independently.  States she occasionally has episodes of some chest tightness, maybe 1-2 episodes a year.  Today she was at the [employer] with her grandson.  She was sitting on a bench whenever she started to experience dizziness which became progressively worse to the point she felt nauseated.  Shortly after developed centralized chest pressure which she states was quite severe in nature.  Her grandson called her sister who brought her to the ED for further evaluation.  She states she did feel nauseated and diaphoretic with this episode.  States she has been told in the past to take Tums with these episodes but this particular one was more severe.  ED her labs showed sodium 139, potassium 5.6, creatinine 0.7, magnesium 2.2, high-sensitivity troponin 3>> 4, WBC 6.2, hemoglobin 14.4.  Chest x-ray negative.  EKG showed sinus rhythm, 64 bpm, T wave inversion in V1.  Hospitalist admission for further evaluation.  Cardiology asked to evaluate in regards to her chest pain.  She is currently pain-free at the time of interview.   Past Medical History:  Diagnosis Date   Anemia    as a child none current    Arthritis    knees, neck    Asthma 2016-2017   winter - asthmatic bronchitis - resolved   History of hiatal hernia    no treatment   PONV (postoperative nausea and vomiting)    after surgery in 1970   Varicose veins of leg with edema, bilateral     Past Surgical History:  Procedure Laterality Date   ADENOIDECTOMY     ARTERY BIOPSY Left 05/30/2017   Procedure: BIOPSY EYE LOWER;  Surgeon: Karle Starch, MD;  Location: [place];  Service: Ophthalmology;  Laterality: Left;   BROW LIFT Bilateral 05/30/2017   Procedure: BLEPHAROPLASTY UPPER EYELID W EXCESS SKIN;  Surgeon: Karle Starch, MD;  Location: [place];  Service: Ophthalmology;  Laterality: Bilateral;   BROW PTOSIS Bilateral 05/30/2017   Procedure: BROW PTOSIS;  Surgeon: Karle Starch, MD;  Location: [place];  Service: Ophthalmology;  Laterality: Bilateral;   COLONOSCOPY WITH PROPOFOL N/A 12/09/2014   Procedure: COLONOSCOPY WITH PROPOFOL;  Surgeon: Garlan Fair, MD;  Location: [facility];  Service: Endoscopy;  Laterality: N/A;   FOOT SURGERY  1970   bone spurs   KNEE CARTILAGE SURGERY     arthroscopic   TUBAL LIGATION     1988     Home Medications:  Prior to Admission medications   Medication Sig Start Date End Date Taking? Authorizing Provider  azelastine (ASTELIN) 0.1 % nasal spray Use 2  sprays per nostril 1-2 times daily as needed 08/13/20   Dara Hoyer, FNP  Biotin 5000 MCG TABS Take by mouth daily.    [provider]  Cholecalciferol (VITAMIN D) 2000 UNITS CAPS Take 2,000 Units by mouth daily.    [provider]  EPINEPHrine 0.3 mg/0.3 mL IJ SOAJ injection Inject 0.3 mg into the muscle as needed for anaphylaxis. 08/06/20   Dara Hoyer, FNP  Evening Primrose Oil 500 MG CAPS Take by mouth daily.    [provider]  glucosamine-chondroitin 500-400 MG tablet Take 3 tablets by mouth daily.     [provider]  ibuprofen (ADVIL,MOTRIN) 200 MG tablet Take 400 mg by mouth every 6 (six)  hours as needed for moderate pain.    [provider]  L-Lysine 500 MG TABS Take by mouth daily.    [provider]  Multiple Vitamin (MULTIVITAMIN WITH MINERALS) TABS tablet Take 1 tablet by mouth daily.    [provider]  vitamin C (ASCORBIC ACID) 500 MG tablet Take 500 mg by mouth daily.    [provider]    Inpatient Medications: Scheduled Meds:  enoxaparin (LOVENOX) injection  40 mg Subcutaneous Q24H   sodium chloride flush  3 mL Intravenous Q12H   Continuous Infusions:  sodium chloride     PRN Meds: acetaminophen **OR** acetaminophen, albuterol, ondansetron **OR** ondansetron (ZOFRAN) IV  Allergies:    Allergies  Allergen Reactions   Other Nausea And Vomiting and Anaphylaxis   Codeine Nausea And Vomiting   Shellfish-Derived Products     Social History:   Social History   Socioeconomic History   Marital status: Widowed    Spouse name: Not on file   Number of children: Not on file   Years of education: Not on file   Highest education level: Not on file  Occupational History   Not on file  Tobacco Use   Smoking status: Former    Types: Cigarettes    Quit date: 1970    Years since quitting: 53.6   Smokeless tobacco: Never  Vaping Use   Vaping Use: Never used  Substance and Sexual Activity   Alcohol use: No   Drug use: No   Sexual activity: Not on file  Other Topics Concern   Not on file  Social History Narrative   Not on file   Social Determinants of Health   Financial Resource Strain: Not on file  Food Insecurity: Not on file  Transportation Needs: Not on file  Physical Activity: Not on file  Stress: Not on file  Social Connections: Not on file  Intimate Partner Violence: Not on file    Family History:    Family History  Problem Relation Age of Onset   Diabetes Mother    Cancer Mother    Heart disease Father      ROS:  Please see the history of present illness.   All other ROS reviewed and negative.      Physical Exam/Data:   Vitals:   10/07/21 1315 10/07/21 1330 10/07/21 1438 10/07/21 1445  BP: (!) 147/84 (!) 154/79 (!) 151/83 (!) 151/88  Pulse: (!) 58 76 67 64  Resp: 19 20 19  (!) 21  Temp:   (!) 97.5 F (36.4 C)   TempSrc:      SpO2: 99% 96% 98% 98%   No intake or output data in the 24 hours ending 10/07/21 1525    08/06/2020    1:36 PM 03/19/2020  2:27 PM 12/21/2018    9:47 AM  Last 3 Weights  Weight (lbs) 157 lb 12.8 oz 154 lb 9.6 oz 150 lb 12.8 oz  Weight (kg) 71.578 kg 70.126 kg 68.402 kg     There is no height or weight on file to calculate BMI.  General:  Well nourished, well developed, in no acute distress HEENT: normal Neck: no JVD Vascular: No carotid bruits; Distal pulses 2+ bilaterally Cardiac:  normal S1, S2; RRR; no murmur  Lungs:  clear to auscultation bilaterally, no wheezing, rhonchi or rales  Abd: soft, nontender, no hepatomegaly  Ext: no edema Musculoskeletal:  No deformities, BUE and BLE strength normal and equal Skin: warm and dry  Neuro:  CNs 2-12 intact, no focal abnormalities noted Psych:  Normal affect   EKG:  The EKG was personally reviewed and demonstrates:   sinus rhythm, 64 bpm, T wave inversion in V1   Relevant CV Studies:  N/a   Laboratory Data:  High Sensitivity Troponin:   Recent Labs  Lab 10/07/21 1121 10/07/21 1342  TROPONINIHS 3 4     Chemistry Recent Labs  Lab 10/07/21 1121 10/07/21 1342  NA 139  --   K 5.6*  --   CL 109  --   CO2 23  --   GLUCOSE 98  --   BUN 17  --   CREATININE 0.72  --   CALCIUM 9.5  --   MG  --  2.2  GFRNONAA >60  --   ANIONGAP 7  --     No results for input(s): "PROT", "ALBUMIN", "AST", "ALT", "ALKPHOS", "BILITOT" in the last 168 hours. Lipids No results for input(s): "CHOL", "TRIG", "HDL", "LABVLDL", "LDLCALC", "CHOLHDL" in the last 168 hours.  Hematology Recent Labs  Lab 10/07/21 1121  WBC 6.2  RBC 4.48  HGB 14.4  HCT 44.0  MCV 98.2  MCH 32.1  MCHC 32.7  RDW 13.8  PLT 195    Thyroid No results for input(s): "TSH", "FREET4" in the last 168 hours.  BNPNo results for input(s): "BNP", "PROBNP" in the last 168 hours.  DDimer No results for input(s): "DDIMER" in the last 168 hours.   Radiology/Studies:  DG Chest 1 View  Result Date: 10/07/2021 CLINICAL DATA:  Dizziness with chest pain and cough. EXAM: CHEST  1 VIEW COMPARISON:  Radiographs 04/09/2014. FINDINGS: 1126 hours. The heart size and mediastinal contours are normal. The lungs are clear. There is no pleural effusion or pneumothorax. No acute osseous findings are identified. IMPRESSION: Stable chest.  No active cardiopulmonary process. Electronically Signed   By: Carey Bullocks M.D.   On: 10/07/2021 11:32     Assessment and Plan:   Marie Farrell is a 76 y.o. female with a hx of PVCs who is being seen 10/07/2021 for the evaluation of chest pain at the request of Dr. Katrinka Blazing.  Dizziness Chest pain  -- Main complaint was dizziness, with associated nausea and then developed severe chest pressure.  This has resolved while in the ED. Question vertigo? High-sensitivity troponin negative x2.  EKG without ischemia.  Denies any significant family history of CAD.  Non-smoker, has not been on medications for hypertension or hyperlipidemia in the past.  --D-dimer pending -- Reasonable to obtain echocardiogram, but further work-up can be completed as an outpatient with plan for coronary CTA if no abnormal findings on echo  Hyperkalemia: K+ 5.6 on admission --BMET in a.m.  For questions or updates, please contact [facility] Please consult [email or web address] for  contact info under    Signed, Laverda Page, NP  10/07/2021 3:25 PM

## 2021-10-08 ENCOUNTER — Observation Stay (HOSPITAL_BASED_OUTPATIENT_CLINIC_OR_DEPARTMENT_OTHER)
Admit: 2021-10-08 | Discharge: 2021-10-08 | Disposition: A | Discharge status: Home / Self Care | Payer: Medicare PPO | Attending: Cardiology | Admitting: Cardiology

## 2021-10-08 ENCOUNTER — Observation Stay (HOSPITAL_COMMUNITY): Payer: Medicare PPO

## 2021-10-08 DIAGNOSIS — R42 Dizziness and giddiness: Secondary | ICD-10-CM | POA: Diagnosis not present

## 2021-10-08 DIAGNOSIS — R55 Syncope and collapse: Secondary | ICD-10-CM | POA: Diagnosis not present

## 2021-10-08 DIAGNOSIS — E875 Hyperkalemia: Secondary | ICD-10-CM | POA: Diagnosis not present

## 2021-10-08 DIAGNOSIS — R079 Chest pain, unspecified: Secondary | ICD-10-CM | POA: Diagnosis not present

## 2021-10-08 LAB — CBC
HCT: 37.9 % (ref 36.0–46.0)
Hemoglobin: 12.6 g/dL (ref 12.0–15.0)
MCH: 31.7 pg (ref 26.0–34.0)
MCHC: 33.2 g/dL (ref 30.0–36.0)
MCV: 95.2 fL (ref 80.0–100.0)
Platelets: 170 10*3/uL (ref 150–400)
RBC: 3.98 MIL/uL (ref 3.87–5.11)
RDW: 13.8 % (ref 11.5–15.5)
WBC: 5.9 10*3/uL (ref 4.0–10.5)
nRBC: 0 % (ref 0.0–0.2)

## 2021-10-08 LAB — BASIC METABOLIC PANEL
Anion gap: 5 (ref 5–15)
BUN: 13 mg/dL (ref 8–23)
CO2: 22 mmol/L (ref 22–32)
Calcium: 8.6 mg/dL — ABNORMAL LOW (ref 8.9–10.3)
Chloride: 111 mmol/L (ref 98–111)
Creatinine, Ser: 0.68 mg/dL (ref 0.44–1.00)
GFR, Estimated: >60 mL/min (ref >60)
Glucose, Bld: 95 mg/dL (ref 70–99)
Potassium: 3.7 mmol/L (ref 3.5–5.1)
Sodium: 138 mmol/L (ref 135–145)

## 2021-10-08 MED ORDER — NITROGLYCERIN 0.4 MG SL SUBL
0.8000 mg | SUBLINGUAL_TABLET | Freq: Once | SUBLINGUAL | Status: AC
  Administered 2021-10-08: 0.8 mg via SUBLINGUAL

## 2021-10-08 MED ORDER — NITROGLYCERIN 0.4 MG SL SUBL
SUBLINGUAL_TABLET | SUBLINGUAL | Status: AC
  Filled 2021-10-08: qty 2

## 2021-10-08 MED ORDER — IOHEXOL 350 MG/ML SOLN
100.0000 mL | Freq: Once | INTRAVENOUS | Status: AC | PRN
  Administered 2021-10-08: 100 mL via INTRAVENOUS

## 2021-10-08 NOTE — Progress Notes (Signed)
Progress Note  Patient Name: Marie Farrell [facility] Date of Encounter: 10/08/2021  [facility] Cardiologist: Sinclair Grooms, MD   Subjective   Patient is doing well this AM. Denies chest pain, sob, palpitations, dizziness.   Inpatient Medications    Scheduled Meds:  enoxaparin (LOVENOX) injection  40 mg Subcutaneous Q24H   sodium chloride flush  3 mL Intravenous Q12H   Continuous Infusions:  sodium chloride 75 mL/hr at 10/07/21 2104   PRN Meds: acetaminophen **OR** acetaminophen, albuterol, metoprolol tartrate, ondansetron **OR** ondansetron (ZOFRAN) IV   Vital Signs    Vitals:   10/07/21 1940 10/07/21 2333 10/08/21 0339 10/08/21 0341  BP: 118/80 129/72  129/77  Pulse: 80   67  Resp:  18    Temp: 98.2 F (36.8 C) 97.7 F (36.5 C)  97.8 F (36.6 C)  TempSrc: Oral Oral  Oral  SpO2: 95% 98%  95%  Weight:   74.2 kg   Height:        Intake/Output Summary (Last 24 hours) at 10/08/2021 0826 Last data filed at 10/08/2021 [id number] Gross per 24 hour  Intake 40 ml  Output 1075 ml  Net -1035 ml      10/08/2021    3:39 AM 10/07/2021    4:49 PM 08/06/2020    1:36 PM  Last 3 Weights  Weight (lbs) 163 lb 9.6 oz 162 lb 14.7 oz 157 lb 12.8 oz  Weight (kg) 74.208 kg 73.9 kg 71.578 kg      Telemetry    Sinus rhythm, HR in the 70s-80s - Personally Reviewed  ECG    No new tracings - Personally Reviewed  Physical Exam   GEN: No acute distress. Sitting comfortably in the bed  Neck: No JVD Cardiac: RRR, no murmurs, rubs, or gallops. Radial pulses 2+ bilaterally  Respiratory: Clear to auscultation bilaterally. GI: Soft, nontender, non-distended  MS: No edema; No deformity. Neuro:  Nonfocal  Psych: Normal affect   Labs    High Sensitivity Troponin:   Recent Labs  Lab 10/07/21 1121 10/07/21 1342  TROPONINIHS 3 4     Chemistry Recent Labs  Lab 10/07/21 1121 10/07/21 1342 10/08/21 0233  NA 139  --  138  K 5.6*  --  3.7  CL 109  --  111  CO2 23  --  22   GLUCOSE 98  --  95  BUN 17  --  13  CREATININE 0.72  --  0.68  CALCIUM 9.5  --  8.6*  MG  --  2.2  --   GFRNONAA >60  --  >60  ANIONGAP 7  --  5    Lipids No results for input(s): "CHOL", "TRIG", "HDL", "LABVLDL", "LDLCALC", "CHOLHDL" in the last 168 hours.  Hematology Recent Labs  Lab 10/07/21 1121 10/08/21 0233  WBC 6.2 5.9  RBC 4.48 3.98  HGB 14.4 12.6  HCT 44.0 37.9  MCV 98.2 95.2  MCH 32.1 31.7  MCHC 32.7 33.2  RDW 13.8 13.8  PLT 195 170   Thyroid  Recent Labs  Lab 10/07/21 1616  TSH 1.141    BNPNo results for input(s): "BNP", "PROBNP" in the last 168 hours.  DDimer  Recent Labs  Lab 10/07/21 1616  DDIMER 0.31     Radiology    ECHOCARDIOGRAM COMPLETE  Result Date: 10/07/2021    ECHOCARDIOGRAM REPORT   Patient Name:   [name] Date of Exam: 10/07/2021 Medical Rec #:  [id number]      Height:  65.5 in Accession #:    [id number]     Weight:       162.9 lb Date of Birth:  04/25/1945       BSA:          1.823 m Patient Age:    76 years       BP:           147/84 mmHg Patient Gender: F              HR:           70 bpm. Exam Location:  Inpatient Procedure: 2D Echo, Cardiac Doppler and Color Doppler Indications:    chest pain  History:        Patient has no prior history of Echocardiogram examinations.  Sonographer:    NA Referring Phys: [id number] LINDSAY B ROBERTS IMPRESSIONS  1. Left ventricular ejection fraction, by estimation, is 60 to 65%. The left ventricle has normal function. The left ventricle has no regional wall motion abnormalities. Left ventricular diastolic parameters are consistent with Grade I diastolic dysfunction (impaired relaxation).  2. Right ventricular systolic function is normal. The right ventricular size is normal. There is normal pulmonary artery systolic pressure. The estimated right ventricular systolic pressure is 27.4 mmHg.  3. Left atrial size was mildly dilated.  4. The mitral valve is normal in structure. Trivial mitral valve  regurgitation. No evidence of mitral stenosis.  5. The aortic valve is tricuspid. There is mild calcification of the aortic valve. Aortic valve regurgitation is mild. No aortic stenosis is present.  6. The inferior vena cava is normal in size with greater than 50% respiratory variability, suggesting right atrial pressure of 3 mmHg. FINDINGS  Left Ventricle: Left ventricular ejection fraction, by estimation, is 60 to 65%. The left ventricle has normal function. The left ventricle has no regional wall motion abnormalities. The left ventricular internal cavity size was normal in size. There is  no left ventricular hypertrophy. Left ventricular diastolic parameters are consistent with Grade I diastolic dysfunction (impaired relaxation). Right Ventricle: The right ventricular size is normal. No increase in right ventricular wall thickness. Right ventricular systolic function is normal. There is normal pulmonary artery systolic pressure. The tricuspid regurgitant velocity is 2.47 m/s, and  with an assumed right atrial pressure of 3 mmHg, the estimated right ventricular systolic pressure is 27.4 mmHg. Left Atrium: Left atrial size was mildly dilated. Right Atrium: Right atrial size was normal in size. Pericardium: There is no evidence of pericardial effusion. Mitral Valve: The mitral valve is normal in structure. Mild mitral annular calcification. Trivial mitral valve regurgitation. No evidence of mitral valve stenosis. Tricuspid Valve: The tricuspid valve is normal in structure. Tricuspid valve regurgitation is trivial. Aortic Valve: The aortic valve is tricuspid. There is mild calcification of the aortic valve. Aortic valve regurgitation is mild. Aortic regurgitation PHT measures 391 msec. No aortic stenosis is present. Pulmonic Valve: The pulmonic valve was normal in structure. Pulmonic valve regurgitation is not visualized. Aorta: The aortic root is normal in size and structure. Venous: The inferior vena cava is normal  in size with greater than 50% respiratory variability, suggesting right atrial pressure of 3 mmHg. IAS/Shunts: No atrial level shunt detected by color flow Doppler.  LEFT VENTRICLE PLAX 2D LVIDd:         4.10 cm   Diastology LVIDs:         2.80 cm   LV e' medial:    7.40 cm/s LV PW:  1.20 cm   LV E/e' medial:  9.0 LV IVS:        0.90 cm   LV e' lateral:   9.25 cm/s LVOT diam:     2.10 cm   LV E/e' lateral: 7.2 LV SV:         65 LV SV Index:   36 LVOT Area:     3.46 cm  RIGHT VENTRICLE             IVC RV Basal diam:  3.30 cm     IVC diam: 1.70 cm RV S prime:     14.50 cm/s TAPSE (M-mode): 2.6 cm LEFT ATRIUM             Index        RIGHT ATRIUM           Index LA diam:        3.90 cm 2.14 cm/m   RA Area:     16.50 cm LA Vol (A2C):   66.6 ml 36.53 ml/m  RA Volume:   45.00 ml  24.68 ml/m LA Vol (A4C):   63.9 ml 35.04 ml/m LA Biplane Vol: 65.1 ml 35.70 ml/m  AORTIC VALVE LVOT Vmax:   93.60 cm/s LVOT Vmean:  63.100 cm/s LVOT VTI:    0.187 m AI PHT:      391 msec  AORTA Ao Root diam: 3.40 cm Ao Asc diam:  3.40 cm MITRAL VALVE               TRICUSPID VALVE MV Area (PHT): 3.31 cm    TR Peak grad:   24.4 mmHg MV Decel Time: 229 msec    TR Vmax:        247.00 cm/s MV E velocity: 66.90 cm/s MV A velocity: 81.00 cm/s  SHUNTS MV E/A ratio:  0.83        Systemic VTI:  0.19 m                            Systemic Diam: 2.10 cm Dalton McleanMD Electronically signed by Wilfred Lacy Signature Date/Time: 10/07/2021/5:38:18 PM    Final    DG Chest 1 View  Result Date: 10/07/2021 CLINICAL DATA:  Dizziness with chest pain and cough. EXAM: CHEST  1 VIEW COMPARISON:  Radiographs 04/09/2014. FINDINGS: 1126 hours. The heart size and mediastinal contours are normal. The lungs are clear. There is no pleural effusion or pneumothorax. No acute osseous findings are identified. IMPRESSION: Stable chest.  No active cardiopulmonary process. Electronically Signed   By: Carey Bullocks M.D.   On: 10/07/2021 11:32    Cardiac  Studies   Echocardiogram 10/07/21 1. Left ventricular ejection fraction, by estimation, is 60 to 65%. The  left ventricle has normal function. The left ventricle has no regional  wall motion abnormalities. Left ventricular diastolic parameters are  consistent with Grade I diastolic  dysfunction (impaired relaxation).   2. Right ventricular systolic function is normal. The right ventricular  size is normal. There is normal pulmonary artery systolic pressure. The  estimated right ventricular systolic pressure is 27.4 mmHg.   3. Left atrial size was mildly dilated.   4. The mitral valve is normal in structure. Trivial mitral valve  regurgitation. No evidence of mitral stenosis.   5. The aortic valve is tricuspid. There is mild calcification of the  aortic valve. Aortic valve regurgitation is mild. No aortic stenosis is  present.   6. The  inferior vena cava is normal in size with greater than 50%  respiratory variability, suggesting right atrial pressure of 3 mmHg.   Patient Profile     76 y.o. female with a past medical history of PVCs who is being seen for the evaluation of dizziness, chest pain.   Assessment & Plan    Dizziness Chest pain  - Main complaint was dizziness, with associated nausea and then developed severe chest pressure.  This resolved while in the ED.  - High-sensitivity troponin negative x2.   - EKG without ischemia.   - Denies any significant family history of CAD.  Non-smoker, has not been on medications for hypertension or hyperlipidemia in the past.  - D-dimer 0.31, TSH 1.141  - Echocardiogram this admission with EF 60-65%, no WMA, grade I diastolic dysfunction, normal RV systolic function  - Coronary CTA pending this admission -- there is an order in for a one time dose of lopressor 50 mg to be given 2 hours prior to scan to help control HR, RN aware    Hyperkalemia:  - K 5.6 on admission - Down to 3.7 today         For questions or updates, please  contact [facility] Please consult [email or web address] for contact info under        Signed, Margie Billet, PA-C  10/08/2021, 8:26 AM

## 2021-10-08 NOTE — Progress Notes (Signed)
Coronary Ca score 0 and no CAD on coronary CTA.  No further cardiac workup.  We have arranged outpt heart monitor and followup

## 2021-10-08 NOTE — Progress Notes (Signed)
Mobility Specialist Progress Note:   10/08/21 1027  Therapy Vitals  Pulse Rate 78  Mobility  Activity Ambulated with assistance in hallway  Level of Assistance Modified independent, requires aide device or extra time  Assistive Device Front wheel walker  Distance Ambulated (ft) 500 ft  Activity Response Tolerated well  $Mobility charge 1 Mobility   Pt received in bed willing to participate in mobility. No complaints of pain. Left in bed with call bell in reach and all needs met.   [facility] Anatasia Tino Mobility Specialist

## 2021-10-09 DIAGNOSIS — R42 Dizziness and giddiness: Secondary | ICD-10-CM | POA: Diagnosis not present

## 2021-10-09 DIAGNOSIS — R55 Syncope and collapse: Secondary | ICD-10-CM | POA: Diagnosis not present

## 2021-10-11 NOTE — Progress Notes (Signed)
Applied in hospital  Dr. Mayford Knife to read.

## 2021-10-19 ENCOUNTER — Telehealth: Payer: Self-pay | Admitting: Interventional Cardiology

## 2021-10-19 DIAGNOSIS — I471 Supraventricular tachycardia: Secondary | ICD-10-CM

## 2021-10-19 MED ORDER — METOPROLOL TARTRATE 25 MG PO TABS: 25.0000 mg | ORAL_TABLET | Freq: Two times a day (BID) | ORAL | 11 refills | Status: DC

## 2021-10-19 NOTE — Telephone Encounter (Signed)
   Cardiac Monitor Alert  Date of alert:  10/19/2021   Patient Name: Marie Farrell  DOB: 1945-03-19  MRN: [id number]   [facility] Cardiologist: Lesleigh Noe, MD  [facility] EP:  None    Monitor Information: Long Term Monitor-Live Telemetry [ZioAT]  Reason:  chest pain Ordering provider:  Dr. Mayford Knife   Alert Supraventricular Tachycardia - fastest HR:  180 This is the 1st alert for this rhythm.   Next Cardiology Appointment   Date:  11/02/21  Provider:  Manson Passey, PA  The patient was contacted today.  She was symptomatic.  She reports the following symptoms:  felt palpitations and light headed during episode. Symptoms have since resolved.  Arrhythmia, symptoms and history reviewed with Dr. Tenny Craw (DOD).   Plan:  Start Metoprolol 25 mg BID and schedule appointment with EP.     Eilleen Kempf, RN  10/19/2021 2:39 PM

## 2021-10-19 NOTE — Telephone Encounter (Signed)
Reference number [id number]   Marie Farrell from I rhythm calling with critical results.

## 2021-10-25 ENCOUNTER — Telehealth: Payer: Self-pay | Admitting: Interventional Cardiology

## 2021-10-25 NOTE — Telephone Encounter (Signed)
Patient states she wants to start exercising and would like to know if this is okay and would like a recommendation on a fitbit or some kind of watch that is similar. She states she has regularly exercised for several years, but 6 weeks ago stopped because of a cough.

## 2021-10-27 ENCOUNTER — Encounter: Payer: Self-pay | Admitting: Interventional Cardiology

## 2021-10-27 NOTE — Telephone Encounter (Signed)
As long as she continues to take metoprolol and is not having any palpitations, she is free to do what ever exercise she would like to participate in.  He is let us know if there is any chest discomfort or other potentially cardiac symptoms.

## 2021-10-27 NOTE — Discharge Summary (Signed)
Physician Discharge Summary   Patient: Marie Farrell MRN: [id number] DOB: Nov 01, 1945  Admit date:     10/07/2021  Discharge date: 10/08/2021  Discharge Physician: Bonnell Public   PCP: Shon Baton, MD   Recommendations at discharge:   Follow-up with your primary care provider and cardiology team within 1 week of discharge. For outpatient cardiac monitoring as per the cardiology team.  Discharge Diagnoses: Principal Problem:   Chest pain Active Problems:   Hyperkalemia   Dizziness   Hospital Course: Patient is a 76 year old female with past medical history significant for further hernia.  Patient was admitted with chest pain and vague history of dizziness.  Patient was admitted for further work-up and management.  Cardiac work-up came back negative.  Cardiology team was consulted to assist with patient's management.  Patient has been cleared for discharge by the cardiology team.  Patient will follow with the primary care provider and cardiology team within 1 week of discharge.  Cardiology team plans cardiac monitoring on discharge.  Patient is chest pain-free.  Consultants: Cardiology team Procedures performed:  Echocardiogram revealed normal left ventricular ejection fraction and grade 1 diastolic dysfunction. CT coronary was negative for coronary artery disease. Disposition: Home Diet recommendation:  Discharge Diet Orders (From admission, onward)     Start     Ordered   10/08/21 0000  Diet - low sodium heart healthy        10/08/21 1626           Cardiac diet DISCHARGE MEDICATION: Allergies as of 10/08/2021       Reactions   Other Nausea And Vomiting, Anaphylaxis   Codeine Nausea And Vomiting   Shellfish-derived Products         Medication List     STOP taking these medications    azelastine 0.1 % nasal spray Commonly known as: ASTELIN   Biotin 5000 MCG Tabs   Evening Primrose Oil 500 MG Caps       TAKE these medications    ascorbic acid  500 MG tablet Commonly known as: VITAMIN C Take 500 mg by mouth daily.   EPINEPHrine 0.3 mg/0.3 mL Soaj injection Commonly known as: EPI-PEN Inject 0.3 mg into the muscle as needed for anaphylaxis.   multivitamin with minerals Tabs tablet Take 1 tablet by mouth daily.   valACYclovir 500 MG tablet Commonly known as: VALTREX Take 500 mg by mouth daily as needed.   Vitamin D 50 MCG (2000 UT) Caps Take 2,000 Units by mouth daily.        Follow-up Information     Shon Baton, MD Follow up.   Specialty: Internal Medicine Why: The office will call patient. Contact information: [place] [address] [phone]         Belva Crome, MD .   Specialty: Cardiology Contact information: [phone] [address] [id number] [phone]                Discharge Exam: Danley Danker Weights   10/07/21 1649 10/08/21 0339  Weight: 73.9 kg 74.2 kg     Condition at discharge: stable  The results of significant diagnostics from this hospitalization (including imaging, microbiology, ancillary and laboratory) are listed below for reference.   Imaging Studies: CT CORONARY MORPH W/CTA COR W/SCORE W/CA W/CM &/OR WO/CM  Addendum Date: 10/08/2021   ADDENDUM REPORT: 10/08/2021 14:39 HISTORY: Chest pain/anginal equiv, intermediate CAD risk, not treadmill candidate EXAM: Cardiac/Coronary CT TECHNIQUE: The patient was scanned on a  Office manager. PROTOCOL: A 70 kV prospective scan was triggered in the descending thoracic aorta at 111 HU's. Axial non-contrast 3 mm slices were carried out through the heart. The data set was analyzed on a dedicated work station and scored using the Agatston method. Gantry rotation speed was 250 msecs and collimation was .6 mm. Heart rate was optimized medically and sl NTG was given. The 3D data set was reconstructed in 5% intervals of the 35-75 % of the R-R cycle. Systolic and diastolic phases were analyzed on a  dedicated work station using MPR, MIP and VRT modes. The patient received OMNIPAQUE IOHEXOL 350 MG/ML SOLN of contrast. FINDINGS: Coronary calcium score: The patient's coronary artery calcium score is 0, which places the patient in the 0 percentile. Coronary arteries: Normal coronary origins.  Right dominance. Right Coronary Artery: Normal caliber vessel, gives rise to PDA. No significant plaque or stenosis. Left Main Coronary Artery: Normal caliber vessel. No significant plaque or stenosis. Left Anterior Descending Coronary Artery: Normal caliber vessel. No significant plaque or stenosis. Caliber of LAD decreases significantly after giving rise to large branching first diagonal. Left Circumflex Artery: Normal caliber vessel. No significant plaque or stenosis. Gives rise to normal first and large second OM branches. Aorta: Normal size, 35 mm at the mid ascending aorta (level of the PA bifurcation) measured double oblique. No aortic atherosclerosis. No dissection seen in visualized portions of the aorta. Aortic Valve: No calcifications. Trileaflet. Other findings: Normal pulmonary vein drainage into the left atrium. Normal left atrial appendage without a thrombus. Normal size of the pulmonary artery. Normal appearance of the pericardium. IMPRESSION: 1. No evidence of CAD, CADRADS = 0. 2. Coronary calcium score of 0. This was 0 percentile for age and sex matched control. 3. Normal coronary origin with right dominance. INTERPRETATION: CAD-RADS 0: No evidence of CAD (0%). Consider non-atherosclerotic causes of chest pain. Electronically Signed   By: Jodelle Red M.D.   On: 10/08/2021 14:39   Result Date: 10/08/2021 EXAM: OVER-READ INTERPRETATION  CT CHEST The following report is a limited chest CT over-read performed by radiologist Dr. Allegra Lai of [facility] on 10/08/2021. This over-read does not include interpretation of cardiac or coronary anatomy or pathology. The coronary CTA  interpretation by the cardiologist is attached. COMPARISON:  None Available. FINDINGS: Vascular: Normal heart size. No pericardial effusion. Normal caliber thoracic aorta with no significant atherosclerotic disease. No suspicious filling defects of the central pulmonary arteries. Mediastinum/Nodes: Esophagus is unremarkable. No pathologically enlarged lymph nodes seen in the chest. Lungs/Pleura: Central airways are patent. Bibasilar atelectasis. No consolidation, pleural effusion or pneumothorax. Upper Abdomen: No acute abnormality. Musculoskeletal: No chest wall mass or suspicious bone lesions identified. IMPRESSION: No acute extracardiac abnormality. Electronically Signed: By: Allegra Lai M.D. On: 10/08/2021 13:26   ECHOCARDIOGRAM COMPLETE  Result Date: 10/07/2021    ECHOCARDIOGRAM REPORT   Patient Name:   LUVADA SALAMONE Date of Exam: 10/07/2021 Medical Rec #:  [id number]      Height:       65.5 in Accession #:    [id number]     Weight:       162.9 lb Date of Birth:  1945-08-11       BSA:          1.823 m Patient Age:    76 years       BP:           147/84 mmHg Patient Gender: F  HR:           70 bpm. Exam Location:  Inpatient Procedure: 2D Echo, Cardiac Doppler and Color Doppler Indications:    chest pain  History:        Patient has no prior history of Echocardiogram examinations.  Sonographer:    NA Referring Phys: [id number] LINDSAY B ROBERTS IMPRESSIONS  1. Left ventricular ejection fraction, by estimation, is 60 to 65%. The left ventricle has normal function. The left ventricle has no regional wall motion abnormalities. Left ventricular diastolic parameters are consistent with Grade I diastolic dysfunction (impaired relaxation).  2. Right ventricular systolic function is normal. The right ventricular size is normal. There is normal pulmonary artery systolic pressure. The estimated right ventricular systolic pressure is 27.4 mmHg.  3. Left atrial size was mildly dilated.  4. The mitral valve is  normal in structure. Trivial mitral valve regurgitation. No evidence of mitral stenosis.  5. The aortic valve is tricuspid. There is mild calcification of the aortic valve. Aortic valve regurgitation is mild. No aortic stenosis is present.  6. The inferior vena cava is normal in size with greater than 50% respiratory variability, suggesting right atrial pressure of 3 mmHg. FINDINGS  Left Ventricle: Left ventricular ejection fraction, by estimation, is 60 to 65%. The left ventricle has normal function. The left ventricle has no regional wall motion abnormalities. The left ventricular internal cavity size was normal in size. There is  no left ventricular hypertrophy. Left ventricular diastolic parameters are consistent with Grade I diastolic dysfunction (impaired relaxation). Right Ventricle: The right ventricular size is normal. No increase in right ventricular wall thickness. Right ventricular systolic function is normal. There is normal pulmonary artery systolic pressure. The tricuspid regurgitant velocity is 2.47 m/s, and  with an assumed right atrial pressure of 3 mmHg, the estimated right ventricular systolic pressure is 27.4 mmHg. Left Atrium: Left atrial size was mildly dilated. Right Atrium: Right atrial size was normal in size. Pericardium: There is no evidence of pericardial effusion. Mitral Valve: The mitral valve is normal in structure. Mild mitral annular calcification. Trivial mitral valve regurgitation. No evidence of mitral valve stenosis. Tricuspid Valve: The tricuspid valve is normal in structure. Tricuspid valve regurgitation is trivial. Aortic Valve: The aortic valve is tricuspid. There is mild calcification of the aortic valve. Aortic valve regurgitation is mild. Aortic regurgitation PHT measures 391 msec. No aortic stenosis is present. Pulmonic Valve: The pulmonic valve was normal in structure. Pulmonic valve regurgitation is not visualized. Aorta: The aortic root is normal in size and  structure. Venous: The inferior vena cava is normal in size with greater than 50% respiratory variability, suggesting right atrial pressure of 3 mmHg. IAS/Shunts: No atrial level shunt detected by color flow Doppler.  LEFT VENTRICLE PLAX 2D LVIDd:         4.10 cm   Diastology LVIDs:         2.80 cm   LV e' medial:    7.40 cm/s LV PW:         1.20 cm   LV E/e' medial:  9.0 LV IVS:        0.90 cm   LV e' lateral:   9.25 cm/s LVOT diam:     2.10 cm   LV E/e' lateral: 7.2 LV SV:         65 LV SV Index:   36 LVOT Area:     3.46 cm  RIGHT VENTRICLE  IVC RV Basal diam:  3.30 cm     IVC diam: 1.70 cm RV S prime:     14.50 cm/s TAPSE (M-mode): 2.6 cm LEFT ATRIUM             Index        RIGHT ATRIUM           Index LA diam:        3.90 cm 2.14 cm/m   RA Area:     16.50 cm LA Vol (A2C):   66.6 ml 36.53 ml/m  RA Volume:   45.00 ml  24.68 ml/m LA Vol (A4C):   63.9 ml 35.04 ml/m LA Biplane Vol: 65.1 ml 35.70 ml/m  AORTIC VALVE LVOT Vmax:   93.60 cm/s LVOT Vmean:  63.100 cm/s LVOT VTI:    0.187 m AI PHT:      391 msec  AORTA Ao Root diam: 3.40 cm Ao Asc diam:  3.40 cm MITRAL VALVE               TRICUSPID VALVE MV Area (PHT): 3.31 cm    TR Peak grad:   24.4 mmHg MV Decel Time: 229 msec    TR Vmax:        247.00 cm/s MV E velocity: 66.90 cm/s MV A velocity: 81.00 cm/s  SHUNTS MV E/A ratio:  0.83        Systemic VTI:  0.19 m                            Systemic Diam: 2.10 cm Dalton McleanMD Electronically signed by Franki Monte Signature Date/Time: 10/07/2021/5:38:18 PM    Final    DG Chest 1 View  Result Date: 10/07/2021 CLINICAL DATA:  Dizziness with chest pain and cough. EXAM: CHEST  1 VIEW COMPARISON:  Radiographs 04/09/2014. FINDINGS: 1126 hours. The heart size and mediastinal contours are normal. The lungs are clear. There is no pleural effusion or pneumothorax. No acute osseous findings are identified. IMPRESSION: Stable chest.  No active cardiopulmonary process. Electronically Signed   By: Richardean Sale M.D.   On: 10/07/2021 11:32    Microbiology: No results found for this or any previous visit.  Labs: CBC: No results for input(s): "WBC", "NEUTROABS", "HGB", "HCT", "MCV", "PLT" in the last 168 hours. Basic Metabolic Panel: No results for input(s): "NA", "K", "CL", "CO2", "GLUCOSE", "BUN", "CREATININE", "CALCIUM", "MG", "PHOS" in the last 168 hours. Liver Function Tests: No results for input(s): "AST", "ALT", "ALKPHOS", "BILITOT", "PROT", "ALBUMIN" in the last 168 hours. CBG: No results for input(s): "GLUCAP" in the last 168 hours.  Discharge time spent: greater than 30 minutes.  Signed: Bonnell Public, MD [facility] 10/27/2021

## 2021-10-28 NOTE — Telephone Encounter (Signed)
Shared Dr. Smith's response with patient via MyChart message. 

## 2021-11-01 NOTE — Addendum Note (Signed)
Encounter addended by: Cleda Mccreedy, CMA on: 11/01/2021 3:16 PM  Actions taken: Imaging Exam ended

## 2021-11-02 ENCOUNTER — Ambulatory Visit: Payer: Medicare PPO | Admitting: Physician Assistant

## 2021-11-02 ENCOUNTER — Ambulatory Visit: Payer: Medicare PPO

## 2021-11-02 ENCOUNTER — Encounter: Payer: Self-pay | Admitting: Physician Assistant

## 2021-11-02 ENCOUNTER — Ambulatory Visit
Admission: RE | Admit: 2021-11-02 | Discharge: 2021-11-02 | Disposition: A | Discharge status: Home / Self Care | Payer: Medicare PPO | Source: Ambulatory Visit | Attending: Internal Medicine | Admitting: Internal Medicine

## 2021-11-02 VITALS — BP 108/78 | HR 66 | Ht 65.5 in | Wt 165.0 lb

## 2021-11-02 DIAGNOSIS — Z1231 Encounter for screening mammogram for malignant neoplasm of breast: Secondary | ICD-10-CM | POA: Diagnosis not present

## 2021-11-02 DIAGNOSIS — I471 Supraventricular tachycardia: Secondary | ICD-10-CM

## 2021-11-02 NOTE — Progress Notes (Signed)
Cardiology Office Note:    Date:  11/02/2021   ID:  Marie Farrell, Marie Farrell 11-Jan-1946, MRN [id number]  PCP:  Shon Baton, MD  [facility] Cardiologist:  Sinclair Grooms, MD  [place] Electrophysiologist:  None   Chief Complaint: Hospital follow up   History of Present Illness:    Marie Farrell is a 76 y.o. female with a hx of PVCs seen for hospital follow up.   She was seen by Dr. Tamala Julian in the office 03/2020 as a referral for PVCs from her OB/GYN. She wore an outpatient 30d monitor with no significant arrhythmias noted.   Admitted 09/2021 for dizziness and chest pain. Echocardiogram with EF 60-65%, no WMA, grade I diastolic dysfunction, normal RV systolic function. Coronary CT with calcium score of 0.   Monitor showed predominant underlying rhythm was Sinus Rhythm with SVT, longest 27 sec. Some rhythm looks like A.tachy.  No afib.   Patient is here for follow-up.  Had multiple questions all answered.  She is symptomatic with SVT . One of her episode was triggered after eating chocolate.  She denies chest pain, shortness of breath, orthopnea, or melena.  TSH was normal in hospital.   Past Medical History:  Diagnosis Date   Anemia    as a child none current    Arthritis    knees, neck   Asthma 2016-2017   winter - asthmatic bronchitis - resolved   History of hiatal hernia    no treatment   PONV (postoperative nausea and vomiting)    after surgery in 1970   Varicose veins of leg with edema, bilateral     Past Surgical History:  Procedure Laterality Date   ADENOIDECTOMY     ARTERY BIOPSY Left 05/30/2017   Procedure: BIOPSY EYE LOWER;  Surgeon: Karle Starch, MD;  Location: [place];  Service: Ophthalmology;  Laterality: Left;   BROW LIFT Bilateral 05/30/2017   Procedure: BLEPHAROPLASTY UPPER EYELID W EXCESS SKIN;  Surgeon: Karle Starch, MD;  Location: [place];  Service: Ophthalmology;  Laterality: Bilateral;   BROW PTOSIS Bilateral 05/30/2017    Procedure: BROW PTOSIS;  Surgeon: Karle Starch, MD;  Location: [place];  Service: Ophthalmology;  Laterality: Bilateral;   COLONOSCOPY WITH PROPOFOL N/A 12/09/2014   Procedure: COLONOSCOPY WITH PROPOFOL;  Surgeon: Garlan Fair, MD;  Location: [facility];  Service: Endoscopy;  Laterality: N/A;   FOOT SURGERY  1970   bone spurs   KNEE CARTILAGE SURGERY     arthroscopic   TUBAL LIGATION     1988    Current Medications: Current Meds  Medication Sig   BIOTIN PO Take by mouth.   Cholecalciferol (VITAMIN D) 2000 UNITS CAPS Take 2,000 Units by mouth daily.   EPINEPHrine 0.3 mg/0.3 mL IJ SOAJ injection Inject 0.3 mg into the muscle as needed for anaphylaxis.   L-LYSINE PO Take by mouth.   metoprolol tartrate (LOPRESSOR) 25 MG tablet Take 1 tablet (25 mg total) by mouth 2 (two) times daily. May take one extra tablet as needed for Palpitations.   Multiple Vitamin (MULTIVITAMIN WITH MINERALS) TABS tablet Take 1 tablet by mouth daily.   valACYclovir (VALTREX) 500 MG tablet Take 500 mg by mouth daily as needed.   vitamin C (ASCORBIC ACID) 500 MG tablet Take 500 mg by mouth daily.     Allergies:   Codeine and Shellfish-derived products   Social History   Socioeconomic History   Marital status: Widowed  Spouse name: Not on file   Number of children: Not on file   Years of education: Not on file   Highest education level: Not on file  Occupational History   Not on file  Tobacco Use   Smoking status: Former    Types: Cigarettes    Quit date: 24    Years since quitting: 53.6   Smokeless tobacco: Never  Vaping Use   Vaping Use: Never used  Substance and Sexual Activity   Alcohol use: No   Drug use: No   Sexual activity: Not on file  Other Topics Concern   Not on file  Social History Narrative   Not on file   Social Determinants of Health   Financial Resource Strain: Not on file  Food Insecurity: Not on file  Transportation Needs: Not on file  Physical  Activity: Not on file  Stress: Not on file  Social Connections: Not on file     Family History: The patient's family history includes Cancer in her mother; Diabetes in her mother; Heart disease in her father.    ROS:   Please see the history of present illness.    All other systems reviewed and are negative.   EKGs/Labs/Other Studies Reviewed:    The following studies were reviewed today:  Monitor 10/08/21 Patch Wear Time:  13 days and 20 hours (2023-07-28T14:42:37-0400 to 2023-08-11T10:45:18-0400)   Patient had a min HR of 48 bpm, max HR of 200 bpm, and avg HR of 82 bpm. Predominant underlying rhythm was Sinus Rhythm. 25 Supraventricular Tachycardia runs occurred, the run with the fastest interval lasting 18 mins 3 secs with a max rate of 200 bpm,  the longest lasting 27 mins 33 secs with an avg rate of 168 bpm. Some episodes of Supraventricular Tachycardia may be possible Atrial Tachycardia with variable block. Isolated SVEs were rare (<1.0%), SVE Couplets were rare (<1.0%), and SVE Triplets were  rare (<1.0%). Isolated VEs were rare (<1.0%), VE Couplets were rare (<1.0%), and no VE Triplets were present. Previously notified: MD notification criteria for Supraventricular Tachycardia met - notified Rito Ehrlich RN on 19 Oct 2021 at 01:02 PM ET [facility]).  Coronary CT 10/08/21 IMPRESSION: 1. No evidence of CAD, CADRADS = 0.   2. Coronary calcium score of 0. This was 0 percentile for age and sex matched control.   3. Normal coronary origin with right dominance.   INTERPRETATION:   CAD-RADS 0: No evidence of CAD (0%). Consider non-atherosclerotic causes of chest pain.  Echo 10/07/21 1. Left ventricular ejection fraction, by estimation, is 60 to 65%. The  left ventricle has normal function. The left ventricle has no regional  wall motion abnormalities. Left ventricular diastolic parameters are  consistent with Grade I diastolic  dysfunction (impaired relaxation).   2. Right ventricular  systolic function is normal. The right ventricular  size is normal. There is normal pulmonary artery systolic pressure. The  estimated right ventricular systolic pressure is 03.7 mmHg.   3. Left atrial size was mildly dilated.   4. The mitral valve is normal in structure. Trivial mitral valve  regurgitation. No evidence of mitral stenosis.   5. The aortic valve is tricuspid. There is mild calcification of the  aortic valve. Aortic valve regurgitation is mild. No aortic stenosis is  present.   6. The inferior vena cava is normal in size with greater than 50%  respiratory variability, suggesting right atrial pressure of 3 mmHg.   EKG:  EKG is not ordered today.  T Recent Labs: 10/07/2021: Magnesium 2.2; TSH 1.141 10/08/2021: BUN 13; Creatinine, Ser 0.68; Hemoglobin 12.6; Platelets 170; Potassium 3.7; Sodium 138  Recent Lipid Panel No results found for: "CHOL", "TRIG", "HDL", "CHOLHDL", "VLDL", "LDLCALC", "LDLDIRECT"  Physical Exam:    VS:  BP 108/78   Pulse 66   Ht 5' 5.5" (1.664 m)   Wt 165 lb (74.8 kg)   SpO2 95%   BMI 27.04 kg/m     Wt Readings from Last 3 Encounters:  11/02/21 165 lb (74.8 kg)  10/08/21 163 lb 9.6 oz (74.2 kg)  08/06/20 157 lb 12.8 oz (71.6 kg)     GEN:  Well nourished, well developed in no acute distress HEENT: Normal NECK: No JVD; No carotid bruits LYMPHATICS: No lymphadenopathy CARDIAC: RRR, no murmurs, rubs, gallops RESPIRATORY:  Clear to auscultation without rales, wheezing or rhonchi  ABDOMEN: Soft, non-tender, non-distended MUSCULOSKELETAL:  No edema; No deformity  SKIN: Warm and dry NEUROLOGIC:  Alert and oriented x 3 PSYCHIATRIC:  Normal affect   ASSESSMENT AND PLAN:    SVT She is symptomatic with SVT.  One of her trigger was dark chocolate.  Reviewed trigger in detail.  Advised to keep diary.  Continue metoprolol tartrate 25 mg twice daily.  Does not want to titrate further today.  Advised to take extra metoprolol for breakthrough  palpitation.  Follow-up with Dr. Lovena Le as scheduled.  TSH was normal.  Reassuring echocardiogram and coronary CT.  djustments/Labs and Tests Ordered: Current medicines are reviewed at length with the patient today.  Concerns regarding medicines are outlined above.  No orders of the defined types were placed in this encounter.  No orders of the defined types were placed in this encounter.   Patient Instructions  Medication Instructions:   Your physician recommends that you continue on your current medications as directed. Please refer to the Current Medication list given to you today.  *If you need a refill on your cardiac medications before your next appointment, please call your pharmacy*   Follow-Up:  As scheduled with Dr. Lovena Le Electrophysiologist on 12/02/21 at 9 am.   :1}  Other Instructions  Supraventricular Tachycardia, Adult Supraventricular tachycardia (SVT) is a kind of abnormal heartbeat. It makes your heart beat very fast. This may last for a short time and then return to normal, or it may last longer. A normal resting heartbeat is 60-100 times a minute. This condition can make your heart beat more than 150 times a minute. Times of having a fast heartbeat (episodes) can be scary, but they are usually not dangerous. In some cases, they may lead to heart failure if they: Happen many times a day. Last longer than a few seconds. What are the causes?  This condition happens when electrical signals are sent out from areas of the heart that do not normally send signals for the heartbeat. What increases the risk? You are more likely to develop this condition if you are: Middle aged or younger. Female. The following factors may also make you more likely to develop this condition: Stress. Feeling worried or nervous (anxiety). Tiredness. Smoking. Stimulant drugs, such as cocaine and methamphetamine. Alcohol. Caffeine. Pregnancy. Having certain medical conditions. What  are the signs or symptoms? A pounding heart. A feeling that your heart is skipping beats (palpitations). Weakness. Trouble getting enough air. Pain or tightness in your chest. Dizziness or feeling like you are going to pass out (faint). Feeling worried or nervous. Sweating. Feeling like you may vomit (nausea). Passing out.  Tiredness. Sometimes, there are no symptoms. How is this treated? Treatment may include: Vagal nerve stimulation. Ways to do this include: Holding your breath and pushing, as though you are pooping (having a bowel movement). Massaging an area on one side of your neck. Do not try this yourself. Only a doctor should do this. If done the wrong way, it can lead to a stroke. Bending forward with your head between your legs. Coughing while bending forward with your head between your legs. Putting an ice-cold, wet towel on your face. Medicines that prevent attacks. Medicine to stop an attack given through an IV tube at the hospital. A small electric shock (cardioversion) that stops an attack. A procedure to get rid of cells in the area that is causing the fast heartbeats (radiofrequency ablation). If you do not have symptoms, you may not need treatment. Follow these instructions at home: Stress Avoid things that make you feel stressed. To deal with stress, try: Doing yoga or meditation. Being out in nature. Listening to relaxing music. Doing deep breathing. Taking steps to be healthy, such as getting lots of sleep, exercising, and eating a balanced diet. Talking with a mental health doctor. Lifestyle  Try to get at least 7 hours of sleep each night. Do not smoke or use any products that contain nicotine or tobacco. If you need help quitting, ask your doctor. Do not drink alcohol if it gives you a fast heartbeat. If alcohol does not seem to give you a fast heartbeat, limit your alcohol use. If you drink alcohol: Limit how much you have to: 0-1 drink a day for  women who are not pregnant. 0-2 drinks a day for men. Know how much alcohol is in your drink. In the [place] one drink equals one 12 oz bottle of beer (355 mL), one 5 oz glass of wine (148 mL), or one 1 oz glass of hard liquor (44 mL). Be aware of how caffeine affects you. If caffeine gives you a fast heartbeat, do not eat, drink, or use anything with caffeine in it. If caffeine does not seem to give you a fast heartbeat, limit how much caffeine you eat, drink, or use. Do not use stimulant drugs. If you need help quitting, ask your doctor. General instructions Stay at a healthy weight. Exercise regularly. Ask your doctor about good activities for you. Try one or a mixture of these: 150 minutes a week of gentle exercise, like walking or yoga. 75 minutes a week of exercise that is very active, like running or swimming. Do vagus nerve treatments to slow down your heartbeat as told by your doctor. Take over-the-counter and prescription medicines only as told by your doctor. Keep all follow-up visits. Contact a doctor if: You have a fast heartbeat more often. Times of having a fast heartbeat last longer than before. Home treatments to slow down your heartbeat do not help. You have new symptoms. Get help right away if: You have chest pain. Your symptoms get worse. You have trouble breathing. Your heart beats very fast for more than 20 minutes. You pass out. These symptoms may be an emergency. Get medical help right away. Call your local emergency services (911 in the [place] Do not wait to see if the symptoms will go away. Do not drive yourself to the hospital. Summary SVT is a type of abnormal heartbeat. This condition can make your heart beat more than 150 times a minute. If you do not have symptoms, you may not need treatment.  This information is not intended to replace advice given to you by your health care provider. Make sure you discuss any questions you have with your health care  provider. Document Revised: 10/12/2019 Document Reviewed: 10/12/2019 Elsevier Patient Education  [place]         Signed, [place], [place]  11/02/2021 2:11 PM    [facility]

## 2021-11-02 NOTE — Patient Instructions (Addendum)
Medication Instructions:   Your physician recommends that you continue on your current medications as directed. Please refer to the Current Medication list given to you today.  *If you need a refill on your cardiac medications before your next appointment, please call your pharmacy*   Follow-Up:  As scheduled with Dr. Ladona Ridgel Electrophysiologist on 12/02/21 at 9 am.   :1}  Other Instructions  Supraventricular Tachycardia, Adult Supraventricular tachycardia (SVT) is a kind of abnormal heartbeat. It makes your heart beat very fast. This may last for a short time and then return to normal, or it may last longer. A normal resting heartbeat is 60-100 times a minute. This condition can make your heart beat more than 150 times a minute. Times of having a fast heartbeat (episodes) can be scary, but they are usually not dangerous. In some cases, they may lead to heart failure if they: Happen many times a day. Last longer than a few seconds. What are the causes?  This condition happens when electrical signals are sent out from areas of the heart that do not normally send signals for the heartbeat. What increases the risk? You are more likely to develop this condition if you are: Middle aged or younger. Female. The following factors may also make you more likely to develop this condition: Stress. Feeling worried or nervous (anxiety). Tiredness. Smoking. Stimulant drugs, such as cocaine and methamphetamine. Alcohol. Caffeine. Pregnancy. Having certain medical conditions. What are the signs or symptoms? A pounding heart. A feeling that your heart is skipping beats (palpitations). Weakness. Trouble getting enough air. Pain or tightness in your chest. Dizziness or feeling like you are going to pass out (faint). Feeling worried or nervous. Sweating. Feeling like you may vomit (nausea). Passing out. Tiredness. Sometimes, there are no symptoms. How is this treated? Treatment may  include: Vagal nerve stimulation. Ways to do this include: Holding your breath and pushing, as though you are pooping (having a bowel movement). Massaging an area on one side of your neck. Do not try this yourself. Only a doctor should do this. If done the wrong way, it can lead to a stroke. Bending forward with your head between your legs. Coughing while bending forward with your head between your legs. Putting an ice-cold, wet towel on your face. Medicines that prevent attacks. Medicine to stop an attack given through an IV tube at the hospital. A small electric shock (cardioversion) that stops an attack. A procedure to get rid of cells in the area that is causing the fast heartbeats (radiofrequency ablation). If you do not have symptoms, you may not need treatment. Follow these instructions at home: Stress Avoid things that make you feel stressed. To deal with stress, try: Doing yoga or meditation. Being out in nature. Listening to relaxing music. Doing deep breathing. Taking steps to be healthy, such as getting lots of sleep, exercising, and eating a balanced diet. Talking with a mental health doctor. Lifestyle  Try to get at least 7 hours of sleep each night. Do not smoke or use any products that contain nicotine or tobacco. If you need help quitting, ask your doctor. Do not drink alcohol if it gives you a fast heartbeat. If alcohol does not seem to give you a fast heartbeat, limit your alcohol use. If you drink alcohol: Limit how much you have to: 0-1 drink a day for women who are not pregnant. 0-2 drinks a day for men. Know how much alcohol is in your drink. In the [place] one  drink equals one 12 oz bottle of beer (355 mL), one 5 oz glass of wine (148 mL), or one 1 oz glass of hard liquor (44 mL). Be aware of how caffeine affects you. If caffeine gives you a fast heartbeat, do not eat, drink, or use anything with caffeine in it. If caffeine does not seem to give you a fast  heartbeat, limit how much caffeine you eat, drink, or use. Do not use stimulant drugs. If you need help quitting, ask your doctor. General instructions Stay at a healthy weight. Exercise regularly. Ask your doctor about good activities for you. Try one or a mixture of these: 150 minutes a week of gentle exercise, like walking or yoga. 75 minutes a week of exercise that is very active, like running or swimming. Do vagus nerve treatments to slow down your heartbeat as told by your doctor. Take over-the-counter and prescription medicines only as told by your doctor. Keep all follow-up visits. Contact a doctor if: You have a fast heartbeat more often. Times of having a fast heartbeat last longer than before. Home treatments to slow down your heartbeat do not help. You have new symptoms. Get help right away if: You have chest pain. Your symptoms get worse. You have trouble breathing. Your heart beats very fast for more than 20 minutes. You pass out. These symptoms may be an emergency. Get medical help right away. Call your local emergency services (911 in the [place] Do not wait to see if the symptoms will go away. Do not drive yourself to the hospital. Summary SVT is a type of abnormal heartbeat. This condition can make your heart beat more than 150 times a minute. If you do not have symptoms, you may not need treatment. This information is not intended to replace advice given to you by your health care provider. Make sure you discuss any questions you have with your health care provider. Document Revised: 10/12/2019 Document Reviewed: 10/12/2019 Elsevier Patient Education  2023 Else[employer] Inc[employer]      Important Information About Sugar

## 2021-11-11 ENCOUNTER — Ambulatory Visit: Payer: Medicare PPO | Admitting: Physician Assistant

## 2021-11-11 NOTE — Progress Notes (Signed)
Please forward to patients PCP

## 2021-12-02 ENCOUNTER — Encounter: Payer: Self-pay | Admitting: Internal Medicine

## 2021-12-02 ENCOUNTER — Ambulatory Visit: Payer: Medicare PPO | Attending: Internal Medicine | Admitting: Internal Medicine

## 2021-12-02 DIAGNOSIS — I493 Ventricular premature depolarization: Secondary | ICD-10-CM

## 2021-12-02 DIAGNOSIS — I471 Supraventricular tachycardia: Secondary | ICD-10-CM | POA: Diagnosis not present

## 2021-12-02 NOTE — Progress Notes (Signed)
HPI Marie Farrell is referred today by Dr. Katrinka Blazing for evaluation of SVT. She is a pleasant 76 yo woman with a h/o near syncope who was found to have SVT. She in retrospect has probably and SVT for several years but did not have an episode captured. She has a brother with atrial fib. She was hospitalized in July with near syncope but her SVT stopped prior to admit. She wore a cardiac monitor demonstrating what appears to be AVNRT as it started with a PAC and PR prolongation. The SVT has a short RP interval. She feels chest pressure and palpitations. She was placed on metoprolol and has mostly felt controlled.  Allergies  Allergen Reactions   Codeine Nausea And Vomiting   Shellfish-Derived Products      Current Outpatient Medications  Medication Sig Dispense Refill   BIOTIN PO Take by mouth.     Cholecalciferol (VITAMIN D) 2000 UNITS CAPS Take 2,000 Units by mouth daily.     EPINEPHrine 0.3 mg/0.3 mL IJ SOAJ injection Inject 0.3 mg into the muscle as needed for anaphylaxis. 1 each 1   L-LYSINE PO Take by mouth.     metoprolol tartrate (LOPRESSOR) 25 MG tablet Take 1 tablet (25 mg total) by mouth 2 (two) times daily. May take one extra tablet as needed for Palpitations. 60 tablet 11   Multiple Vitamin (MULTIVITAMIN WITH MINERALS) TABS tablet Take 1 tablet by mouth daily.     valACYclovir (VALTREX) 500 MG tablet Take 500 mg by mouth daily as needed.     vitamin C (ASCORBIC ACID) 500 MG tablet Take 500 mg by mouth daily.     No current facility-administered medications for this visit.     Past Medical History:  Diagnosis Date   Anemia    as a child none current    Arthritis    knees, neck   Asthma 2016-2017   winter - asthmatic bronchitis - resolved   History of hiatal hernia    no treatment   PONV (postoperative nausea and vomiting)    after surgery in 1970   Varicose veins of leg with edema, bilateral     ROS:   All systems reviewed and negative except as noted in the  HPI.   Past Surgical History:  Procedure Laterality Date   ADENOIDECTOMY     ARTERY BIOPSY Left 05/30/2017   Procedure: BIOPSY EYE LOWER;  Surgeon: Imagene Riches, MD;  Location: [facility];  Service: Ophthalmology;  Laterality: Left;   BROW LIFT Bilateral 05/30/2017   Procedure: BLEPHAROPLASTY UPPER EYELID W EXCESS SKIN;  Surgeon: Imagene Riches, MD;  Location: [facility];  Service: Ophthalmology;  Laterality: Bilateral;   BROW PTOSIS Bilateral 05/30/2017   Procedure: BROW PTOSIS;  Surgeon: Imagene Riches, MD;  Location: [facility];  Service: Ophthalmology;  Laterality: Bilateral;   COLONOSCOPY WITH PROPOFOL N/A 12/09/2014   Procedure: COLONOSCOPY WITH PROPOFOL;  Surgeon: Charolett Bumpers, MD;  Location: [facility];  Service: Endoscopy;  Laterality: N/A;   FOOT SURGERY  1970   bone spurs   KNEE CARTILAGE SURGERY     arthroscopic   TUBAL LIGATION     1988     Family History  Problem Relation Age of Onset   Diabetes Mother    Cancer Mother    Heart disease Father      Social History   Socioeconomic History   Marital status: Widowed    Spouse name: Not on file  Number of children: Not on file   Years of education: Not on file   Highest education level: Not on file  Occupational History   Not on file  Tobacco Use   Smoking status: Former    Types: Cigarettes    Quit date: 30    Years since quitting: 53.7   Smokeless tobacco: Never  Vaping Use   Vaping Use: Never used  Substance and Sexual Activity   Alcohol use: No   Drug use: No   Sexual activity: Not on file  Other Topics Concern   Not on file  Social History Narrative   Not on file   Social Determinants of Health   Financial Resource Strain: Not on file  Food Insecurity: Not on file  Transportation Needs: Not on file  Physical Activity: Not on file  Stress: Not on file  Social Connections: Not on file  Intimate Partner Violence: Not on file     BP 112/78   Pulse 61   Ht  5' 5.5" (1.664 m)   Wt 168 lb 9.6 oz (76.5 kg)   SpO2 98%   BMI 27.63 kg/m   Physical Exam:  Well appearing 76 yo woman, NAD HEENT: Unremarkable Neck:  No JVD, no thyromegally Lymphatics:  No adenopathy Back:  No CVA tenderness Lungs:  Clear with no wheezes HEART:  Regular rate rhythm, no murmurs, no rubs, no clicks Abd:  soft, positive bowel sounds, no organomegally, no rebound, no guarding Ext:  2 plus pulses, no edema, no cyanosis, no clubbing Skin:  No rashes no nodules Neuro:  CN II through XII intact, motor grossly intact  EKG - nsr with no ventricular pre-excitation  Assess/Plan:  SVT - she is doing fairly well on metoprolol. We discussed the treatment options. If she is controlled on the metoprolol, I have recommended a period of watchful waiting. If she has break through or is intolerant of the SVT then she will consider catheter ablation. I have discussed the indications/risks/benefits/ and she will call us if she would like to pursue this strategy.   Marie Overlie Grier Czerwinski,MD

## 2021-12-02 NOTE — Patient Instructions (Addendum)
Medication Instructions:  Your physician recommends that you continue on your current medications as directed. Please refer to the Current Medication list given to you today.  *If you need a refill on your cardiac medications before your next appointment, please call your pharmacy*  Lab Work: None ordered.  If you have labs (blood work) drawn today and your tests are completely normal, you will receive your results only by: [place] (if you have MyChart) OR A paper copy in the mail If you have any lab test that is abnormal or we need to change your treatment, we will call you to review the results.  Testing/Procedures: None ordered.  Follow-Up: At [facility], you and your health needs are our priority.  As part of our continuing mission to provide you with exceptional heart care, we have created designated Provider Care Teams.  These Care Teams include your primary Cardiologist (physician) and Advanced Practice Providers (APPs -  Physician Assistants and Nurse Practitioners) who all work together to provide you with the care you need, when you need it.  We recommend signing up for the patient portal called "MyChart".  Sign up information is provided on this After Visit Summary.  MyChart is used to connect with patients for Virtual Visits (Telemedicine).  Patients are able to view lab/test results, encounter notes, upcoming appointments, etc.  Non-urgent messages can be sent to your provider as well.   To learn more about what you can do with MyChart, go to [email or web address].    Your next appointment:   Please schedule a 6 MONTH follow up appointment with Dr. Cristopher Peru   The format for your next appointment:   In Person  Provider:   Cristopher Peru, MD{or one of the following Advanced Practice Providers on your designated Care Team:   Tommye Standard, [place] Legrand Como "Jonni Sanger" Chalmers Cater, [place]   Important Information About Sugar

## 2022-01-27 DIAGNOSIS — H26491 Other secondary cataract, right eye: Secondary | ICD-10-CM | POA: Diagnosis not present

## 2022-01-31 DIAGNOSIS — Z01419 Encounter for gynecological examination (general) (routine) without abnormal findings: Secondary | ICD-10-CM | POA: Diagnosis not present

## 2022-01-31 DIAGNOSIS — Z6827 Body mass index (BMI) 27.0-27.9, adult: Secondary | ICD-10-CM | POA: Diagnosis not present

## 2022-02-24 DIAGNOSIS — H26492 Other secondary cataract, left eye: Secondary | ICD-10-CM | POA: Diagnosis not present

## 2022-03-17 DIAGNOSIS — L218 Other seborrheic dermatitis: Secondary | ICD-10-CM | POA: Diagnosis not present

## 2022-03-17 DIAGNOSIS — Z419 Encounter for procedure for purposes other than remedying health state, unspecified: Secondary | ICD-10-CM | POA: Diagnosis not present

## 2022-03-17 DIAGNOSIS — L570 Actinic keratosis: Secondary | ICD-10-CM | POA: Diagnosis not present

## 2022-03-17 DIAGNOSIS — B0089 Other herpesviral infection: Secondary | ICD-10-CM | POA: Diagnosis not present

## 2022-03-17 DIAGNOSIS — L821 Other seborrheic keratosis: Secondary | ICD-10-CM | POA: Diagnosis not present

## 2022-06-09 ENCOUNTER — Ambulatory Visit: Payer: Medicare PPO | Attending: Internal Medicine | Admitting: Internal Medicine

## 2022-06-09 ENCOUNTER — Encounter: Payer: Self-pay | Admitting: Internal Medicine

## 2022-06-09 VITALS — BP 102/64 | HR 65 | Ht 65.5 in | Wt 167.2 lb

## 2022-06-09 DIAGNOSIS — I471 Supraventricular tachycardia, unspecified: Secondary | ICD-10-CM | POA: Diagnosis not present

## 2022-06-09 DIAGNOSIS — I493 Ventricular premature depolarization: Secondary | ICD-10-CM | POA: Diagnosis not present

## 2022-06-09 NOTE — Patient Instructions (Addendum)
Medication Instructions:  Your physician recommends that you continue on your current medications as directed. Please refer to the Current Medication list given to you today.  *If you need a refill on your cardiac medications before your next appointment, please call your pharmacy*  Lab Work: None ordered.  If you have labs (blood work) drawn today and your tests are completely normal, you will receive your results only by: [place] (if you have MyChart) OR A paper copy in the mail If you have any lab test that is abnormal or we need to change your treatment, we will call you to review the results.  Testing/Procedures: None ordered.  Follow-Up: At [facility], you and your health needs are our priority.  As part of our continuing mission to provide you with exceptional heart care, we have created designated Provider Care Teams.  These Care Teams include your primary Cardiologist (physician) and Advanced Practice Providers (APPs -  Physician Assistants and Nurse Practitioners) who all work together to provide you with the care you need, when you need it.  We recommend signing up for the patient portal called "MyChart".  Sign up information is provided on this After Visit Summary.  MyChart is used to connect with patients for Virtual Visits (Telemedicine).  Patients are able to view lab/test results, encounter notes, upcoming appointments, etc.  Non-urgent messages can be sent to your provider as well.   To learn more about what you can do with MyChart, go to [email or web address].    Your next appointment:   1 year, follow up  The format for your next appointment:   In Person  Provider:   Cristopher Peru, MD{or one of the following Advanced Practice Providers on your designated Care Team:   Tommye Standard, [place] Legrand Como "[facility]" [place], [place]

## 2022-06-09 NOTE — Progress Notes (Signed)
HPI Marie Farrell returns today for ongoing evaluation of SVT. She is a pleasant 77 yo woman with a h/o near syncope who was found to have SVT. She in retrospect has probably and SVT for several years but did not have an episode captured. She has a brother with atrial fib. Since I saw her last she has continued to have occaisional but self limited episodes of SVT. The SVT has a short RP interval. She feels chest pressure and palpitations. She was placed on metoprolol and has mostly felt controlled.  Allergies  Allergen Reactions   Codeine Nausea And Vomiting   Shellfish-Derived Products      Current Outpatient Medications  Medication Sig Dispense Refill   BIOTIN PO Take by mouth.     Cholecalciferol (VITAMIN D) 2000 UNITS CAPS Take 2,000 Units by mouth daily.     EPINEPHrine 0.3 mg/0.3 mL IJ SOAJ injection Inject 0.3 mg into the muscle as needed for anaphylaxis. 1 each 1   EVENING PRIMROSE OIL PO Take by mouth. daily     Glucosamine-Chondroitin (GLUCOSAMINE CHONDR COMPLEX PO) Take by mouth. qd     L-LYSINE PO Take by mouth.     metoprolol tartrate (LOPRESSOR) 25 MG tablet Take 1 tablet (25 mg total) by mouth 2 (two) times daily. May take one extra tablet as needed for Palpitations. 60 tablet 11   Multiple Vitamin (MULTIVITAMIN WITH MINERALS) TABS tablet Take 1 tablet by mouth daily.     Multiple Vitamins-Minerals (ZINC PO) Take by mouth. daily     Red Yeast Rice Extract (RED YEAST RICE PO) Take by mouth. daily     valACYclovir (VALTREX) 500 MG tablet Take 500 mg by mouth daily as needed.     vitamin C (ASCORBIC ACID) 500 MG tablet Take 500 mg by mouth daily.     No current facility-administered medications for this visit.     Past Medical History:  Diagnosis Date   Anemia    as a child none current    Arthritis    knees, neck   Asthma 2016-2017   winter - asthmatic bronchitis - resolved   History of hiatal hernia    no treatment   PONV (postoperative nausea and vomiting)     after surgery in 1970   Varicose veins of leg with edema, bilateral     ROS:   All systems reviewed and negative except as noted in the HPI.   Past Surgical History:  Procedure Laterality Date   ADENOIDECTOMY     ARTERY BIOPSY Left 05/30/2017   Procedure: BIOPSY EYE LOWER;  Surgeon: Karle Starch, MD;  Location: [facility];  Service: Ophthalmology;  Laterality: Left;   BROW LIFT Bilateral 05/30/2017   Procedure: BLEPHAROPLASTY UPPER EYELID W EXCESS SKIN;  Surgeon: Karle Starch, MD;  Location: [place];  Service: Ophthalmology;  Laterality: Bilateral;   BROW PTOSIS Bilateral 05/30/2017   Procedure: BROW PTOSIS;  Surgeon: Karle Starch, MD;  Location: [place];  Service: Ophthalmology;  Laterality: Bilateral;   COLONOSCOPY WITH PROPOFOL N/A 12/09/2014   Procedure: COLONOSCOPY WITH PROPOFOL;  Surgeon: Garlan Fair, MD;  Location: [facility];  Service: Endoscopy;  Laterality: N/A;   FOOT SURGERY  1970   bone spurs   KNEE CARTILAGE SURGERY     arthroscopic   TUBAL LIGATION     1988     Family History  Problem Relation Age of Onset   Diabetes Mother  Cancer Mother    Heart disease Father      Social History   Socioeconomic History   Marital status: Widowed    Spouse name: Not on file   Number of children: Not on file   Years of education: Not on file   Highest education level: Not on file  Occupational History   Not on file  Tobacco Use   Smoking status: Former    Types: Cigarettes    Quit date: 25    Years since quitting: 54.2   Smokeless tobacco: Never  Vaping Use   Vaping Use: Never used  Substance and Sexual Activity   Alcohol use: No   Drug use: No   Sexual activity: Not on file  Other Topics Concern   Not on file  Social History Narrative   Not on file   Social Determinants of Health   Financial Resource Strain: Not on file  Food Insecurity: Not on file  Transportation Needs: Not on file  Physical Activity:  Not on file  Stress: Not on file  Social Connections: Not on file  Intimate Partner Violence: Not on file     BP 102/64   Pulse 65   Ht 5' 5.5" (1.664 m)   Wt 167 lb 3.2 oz (75.8 kg)   SpO2 99%   BMI 27.40 kg/m   Physical Exam:  Well appearing NAD HEENT: Unremarkable Neck:  No JVD, no thyromegally Lymphatics:  No adenopathy Back:  No CVA tenderness Lungs:  Clear HEART:  Regular rate rhythm, no murmurs, no rubs, no clicks Abd:  soft, positive bowel sounds, no organomegally, no rebound, no guarding Ext:  2 plus pulses, no edema, no cyanosis, no clubbing Skin:  No rashes no nodules Neuro:  CN II through XII intact, motor grossly intact  EKG - nsr   Assess/Plan:  SVT - she is doing fairly well on metoprolol. We discussed the treatment options. If she is controlled on the metoprolol, I continue to recommended a period of watchful waiting. If she has break through or is intolerant of the SVT then she will consider catheter ablation. I have discussed the indications/risks/benefits/ and she will call us if she would like to pursue this strategy.    Carleene Overlie Tam Savoia,MD

## 2022-08-10 DIAGNOSIS — M79604 Pain in right leg: Secondary | ICD-10-CM | POA: Diagnosis not present

## 2022-08-10 DIAGNOSIS — I83893 Varicose veins of bilateral lower extremities with other complications: Secondary | ICD-10-CM | POA: Diagnosis not present

## 2022-08-10 DIAGNOSIS — M79661 Pain in right lower leg: Secondary | ICD-10-CM | POA: Diagnosis not present

## 2022-08-10 DIAGNOSIS — M79662 Pain in left lower leg: Secondary | ICD-10-CM | POA: Diagnosis not present

## 2022-08-10 DIAGNOSIS — I87393 Chronic venous hypertension (idiopathic) with other complications of bilateral lower extremity: Secondary | ICD-10-CM | POA: Diagnosis not present

## 2022-09-19 DIAGNOSIS — M170 Bilateral primary osteoarthritis of knee: Secondary | ICD-10-CM | POA: Diagnosis not present

## 2022-09-29 ENCOUNTER — Other Ambulatory Visit: Payer: Self-pay | Admitting: Obstetrics and Gynecology

## 2022-09-29 DIAGNOSIS — Z1231 Encounter for screening mammogram for malignant neoplasm of breast: Secondary | ICD-10-CM

## 2022-10-19 DIAGNOSIS — M25561 Pain in right knee: Secondary | ICD-10-CM | POA: Diagnosis not present

## 2022-10-19 DIAGNOSIS — M25562 Pain in left knee: Secondary | ICD-10-CM | POA: Diagnosis not present

## 2022-10-21 ENCOUNTER — Other Ambulatory Visit: Payer: Self-pay | Admitting: Internal Medicine

## 2022-11-01 DIAGNOSIS — M25562 Pain in left knee: Secondary | ICD-10-CM | POA: Diagnosis not present

## 2022-11-01 DIAGNOSIS — M25561 Pain in right knee: Secondary | ICD-10-CM | POA: Diagnosis not present

## 2022-11-03 DIAGNOSIS — M25561 Pain in right knee: Secondary | ICD-10-CM | POA: Diagnosis not present

## 2022-11-03 DIAGNOSIS — M25562 Pain in left knee: Secondary | ICD-10-CM | POA: Diagnosis not present

## 2022-11-08 ENCOUNTER — Ambulatory Visit: Payer: Medicare PPO

## 2022-11-08 ENCOUNTER — Ambulatory Visit
Admission: RE | Admit: 2022-11-08 | Discharge: 2022-11-08 | Disposition: A | Discharge status: Home / Self Care | Payer: Medicare PPO | Source: Ambulatory Visit | Attending: Obstetrics and Gynecology | Admitting: Obstetrics and Gynecology

## 2022-11-08 DIAGNOSIS — Z1231 Encounter for screening mammogram for malignant neoplasm of breast: Secondary | ICD-10-CM

## 2022-11-08 DIAGNOSIS — M25561 Pain in right knee: Secondary | ICD-10-CM | POA: Diagnosis not present

## 2022-11-08 DIAGNOSIS — M25562 Pain in left knee: Secondary | ICD-10-CM | POA: Diagnosis not present

## 2022-11-17 DIAGNOSIS — M25562 Pain in left knee: Secondary | ICD-10-CM | POA: Diagnosis not present

## 2022-11-17 DIAGNOSIS — M25561 Pain in right knee: Secondary | ICD-10-CM | POA: Diagnosis not present

## 2022-11-18 DIAGNOSIS — M8580 Other specified disorders of bone density and structure, unspecified site: Secondary | ICD-10-CM | POA: Diagnosis not present

## 2022-11-18 DIAGNOSIS — E7800 Pure hypercholesterolemia, unspecified: Secondary | ICD-10-CM | POA: Diagnosis not present

## 2022-11-18 DIAGNOSIS — E785 Hyperlipidemia, unspecified: Secondary | ICD-10-CM | POA: Diagnosis not present

## 2022-11-18 DIAGNOSIS — Z1212 Encounter for screening for malignant neoplasm of rectum: Secondary | ICD-10-CM | POA: Diagnosis not present

## 2022-11-22 DIAGNOSIS — D485 Neoplasm of uncertain behavior of skin: Secondary | ICD-10-CM | POA: Diagnosis not present

## 2022-11-22 DIAGNOSIS — L814 Other melanin hyperpigmentation: Secondary | ICD-10-CM | POA: Diagnosis not present

## 2022-11-22 DIAGNOSIS — L570 Actinic keratosis: Secondary | ICD-10-CM | POA: Diagnosis not present

## 2022-11-22 DIAGNOSIS — Z85820 Personal history of malignant melanoma of skin: Secondary | ICD-10-CM | POA: Diagnosis not present

## 2022-11-22 DIAGNOSIS — L821 Other seborrheic keratosis: Secondary | ICD-10-CM | POA: Diagnosis not present

## 2022-11-22 DIAGNOSIS — D1801 Hemangioma of skin and subcutaneous tissue: Secondary | ICD-10-CM | POA: Diagnosis not present

## 2022-11-22 DIAGNOSIS — L649 Androgenic alopecia, unspecified: Secondary | ICD-10-CM | POA: Diagnosis not present

## 2022-11-23 DIAGNOSIS — R82998 Other abnormal findings in urine: Secondary | ICD-10-CM | POA: Diagnosis not present

## 2022-11-23 DIAGNOSIS — Z1212 Encounter for screening for malignant neoplasm of rectum: Secondary | ICD-10-CM | POA: Diagnosis not present

## 2022-11-23 DIAGNOSIS — E785 Hyperlipidemia, unspecified: Secondary | ICD-10-CM | POA: Diagnosis not present

## 2022-11-24 DIAGNOSIS — M25561 Pain in right knee: Secondary | ICD-10-CM | POA: Diagnosis not present

## 2022-11-24 DIAGNOSIS — M25562 Pain in left knee: Secondary | ICD-10-CM | POA: Diagnosis not present

## 2022-11-29 DIAGNOSIS — F418 Other specified anxiety disorders: Secondary | ICD-10-CM | POA: Diagnosis not present

## 2022-11-29 DIAGNOSIS — I471 Supraventricular tachycardia, unspecified: Secondary | ICD-10-CM | POA: Diagnosis not present

## 2022-11-29 DIAGNOSIS — Z1339 Encounter for screening examination for other mental health and behavioral disorders: Secondary | ICD-10-CM | POA: Diagnosis not present

## 2022-11-29 DIAGNOSIS — Z23 Encounter for immunization: Secondary | ICD-10-CM | POA: Diagnosis not present

## 2022-11-29 DIAGNOSIS — Z1331 Encounter for screening for depression: Secondary | ICD-10-CM | POA: Diagnosis not present

## 2022-11-29 DIAGNOSIS — M25561 Pain in right knee: Secondary | ICD-10-CM | POA: Diagnosis not present

## 2022-11-29 DIAGNOSIS — B001 Herpesviral vesicular dermatitis: Secondary | ICD-10-CM | POA: Diagnosis not present

## 2022-11-29 DIAGNOSIS — M25562 Pain in left knee: Secondary | ICD-10-CM | POA: Diagnosis not present

## 2022-11-29 DIAGNOSIS — M8580 Other specified disorders of bone density and structure, unspecified site: Secondary | ICD-10-CM | POA: Diagnosis not present

## 2022-11-29 DIAGNOSIS — R059 Cough, unspecified: Secondary | ICD-10-CM | POA: Diagnosis not present

## 2022-11-29 DIAGNOSIS — Z0000 Encounter for general adult medical examination without abnormal findings: Secondary | ICD-10-CM | POA: Diagnosis not present

## 2022-11-29 DIAGNOSIS — E7800 Pure hypercholesterolemia, unspecified: Secondary | ICD-10-CM | POA: Diagnosis not present

## 2022-11-29 DIAGNOSIS — I8390 Asymptomatic varicose veins of unspecified lower extremity: Secondary | ICD-10-CM | POA: Diagnosis not present

## 2022-11-29 DIAGNOSIS — M1990 Unspecified osteoarthritis, unspecified site: Secondary | ICD-10-CM | POA: Diagnosis not present

## 2022-12-13 DIAGNOSIS — M1711 Unilateral primary osteoarthritis, right knee: Secondary | ICD-10-CM | POA: Diagnosis not present

## 2022-12-13 DIAGNOSIS — M25562 Pain in left knee: Secondary | ICD-10-CM | POA: Diagnosis not present

## 2022-12-13 DIAGNOSIS — M25561 Pain in right knee: Secondary | ICD-10-CM | POA: Diagnosis not present

## 2022-12-15 DIAGNOSIS — M170 Bilateral primary osteoarthritis of knee: Secondary | ICD-10-CM | POA: Diagnosis not present

## 2022-12-22 DIAGNOSIS — M170 Bilateral primary osteoarthritis of knee: Secondary | ICD-10-CM | POA: Diagnosis not present

## 2022-12-27 DIAGNOSIS — I83891 Varicose veins of right lower extremities with other complications: Secondary | ICD-10-CM | POA: Diagnosis not present

## 2022-12-29 DIAGNOSIS — M170 Bilateral primary osteoarthritis of knee: Secondary | ICD-10-CM | POA: Diagnosis not present

## 2023-01-05 DIAGNOSIS — I83892 Varicose veins of left lower extremities with other complications: Secondary | ICD-10-CM | POA: Diagnosis not present

## 2023-01-16 DIAGNOSIS — J309 Allergic rhinitis, unspecified: Secondary | ICD-10-CM | POA: Diagnosis not present

## 2023-01-16 DIAGNOSIS — Z85820 Personal history of malignant melanoma of skin: Secondary | ICD-10-CM | POA: Diagnosis not present

## 2023-01-16 DIAGNOSIS — Q799 Congenital malformation of musculoskeletal system, unspecified: Secondary | ICD-10-CM | POA: Diagnosis not present

## 2023-02-02 DIAGNOSIS — N959 Unspecified menopausal and perimenopausal disorder: Secondary | ICD-10-CM | POA: Diagnosis not present

## 2023-02-02 DIAGNOSIS — Z01419 Encounter for gynecological examination (general) (routine) without abnormal findings: Secondary | ICD-10-CM | POA: Diagnosis not present

## 2023-02-06 DIAGNOSIS — M8588 Other specified disorders of bone density and structure, other site: Secondary | ICD-10-CM | POA: Diagnosis not present

## 2023-02-06 DIAGNOSIS — N958 Other specified menopausal and perimenopausal disorders: Secondary | ICD-10-CM | POA: Diagnosis not present

## 2023-02-12 IMAGING — MG MM DIGITAL SCREENING BILAT W/ TOMO AND CAD
8 series · 8 of 24 positions shown · non-contrast
Comparison: Previous exam(s).

CLINICAL DATA: Screening.

EXAM:
DIGITAL SCREENING BILATERAL MAMMOGRAM WITH TOMOSYNTHESIS AND CAD
TECHNIQUE: Bilateral screening digital craniocaudal and mediolateral oblique
mammograms were obtained. Bilateral screening digital breast
tomosynthesis was performed. The images were evaluated with
computer-aided detection.

[L MLO synth-2D]
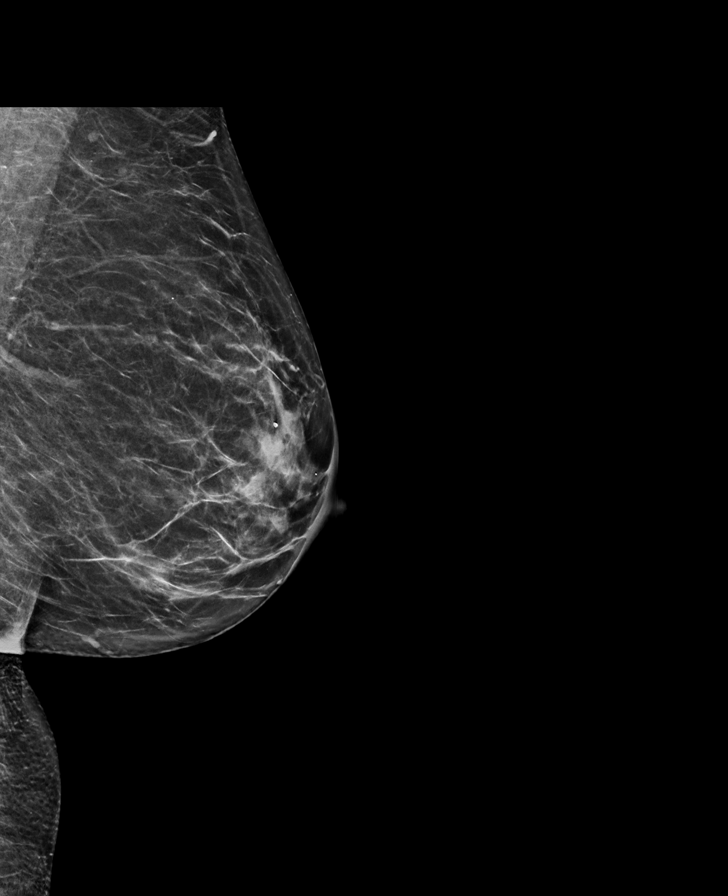

[R CC synth-2D]
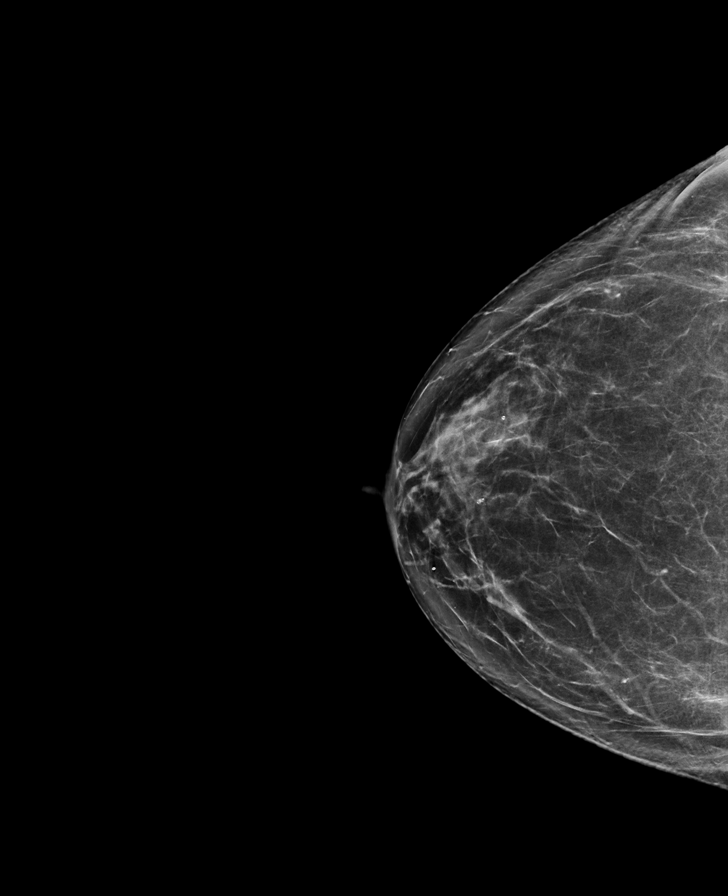

[R MLO synth-2D]
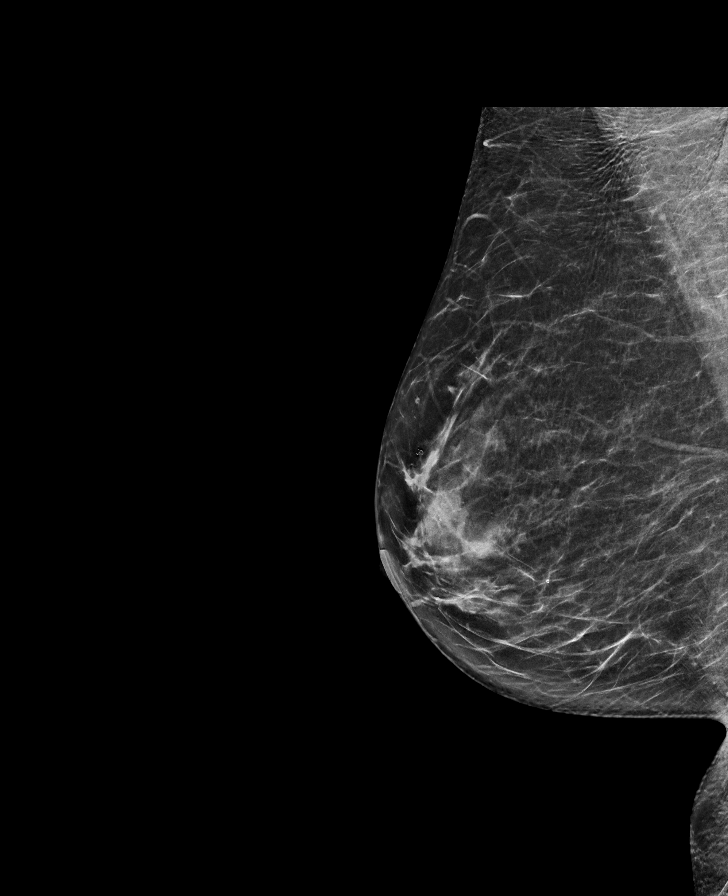

[L CC synth-2D]
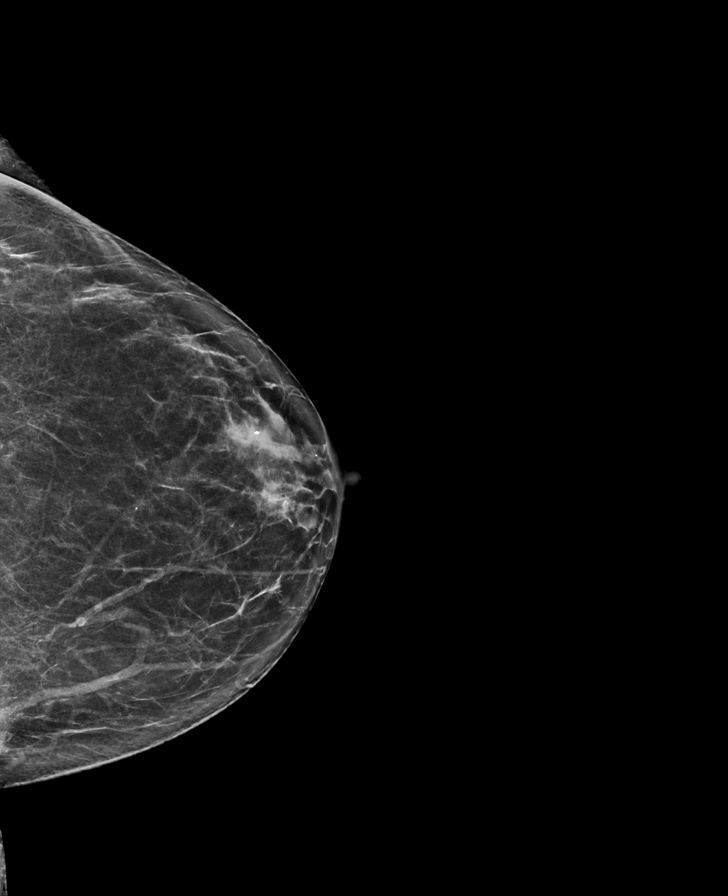

[R CC tomo · tomo slice 37/73.0]
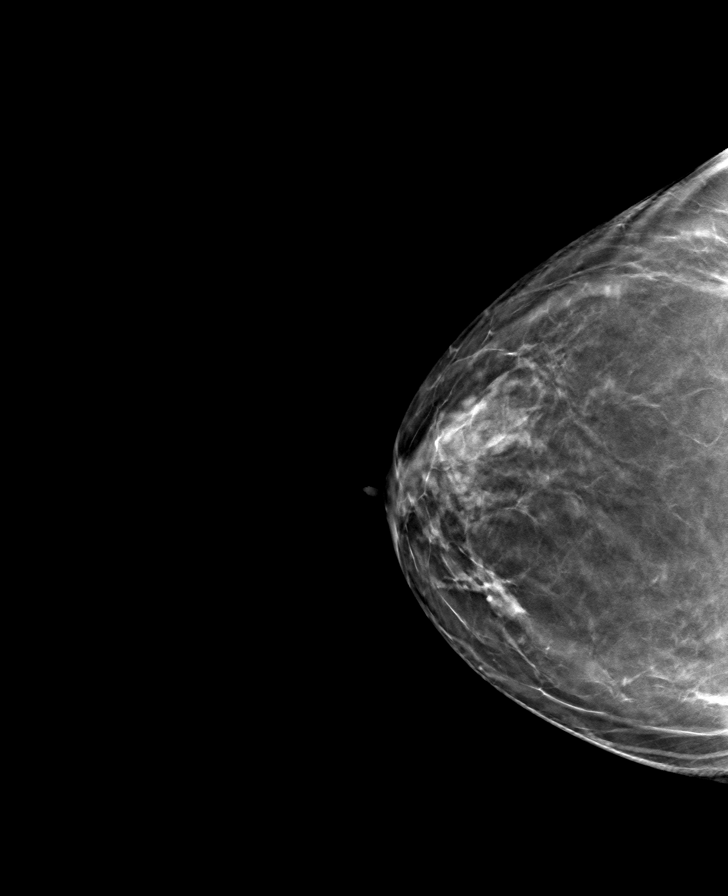

[R MLO tomo · tomo slice 34/67.0]
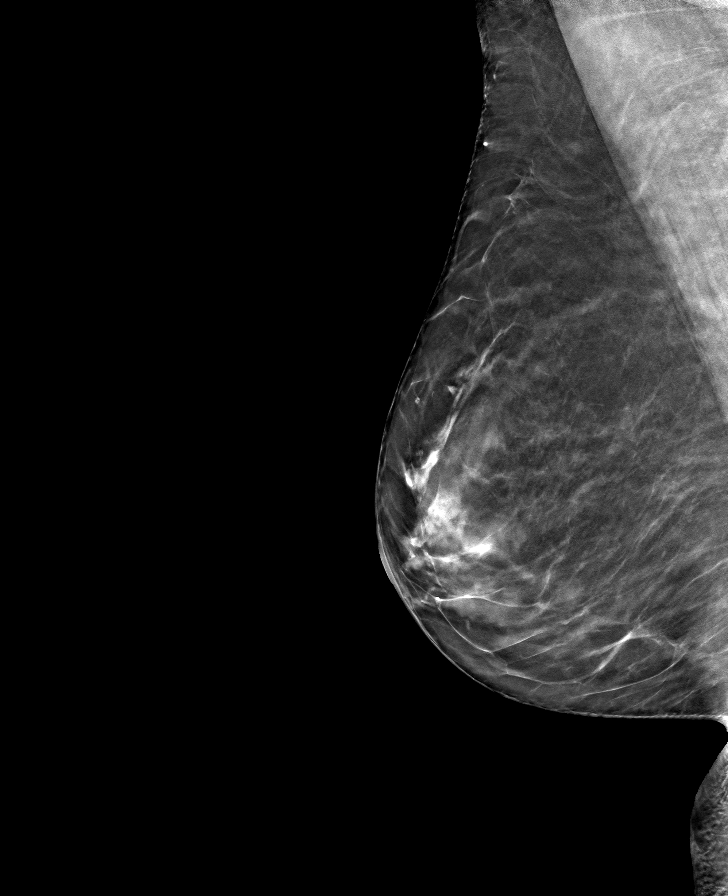

[L MLO tomo · tomo slice 31/62.0]
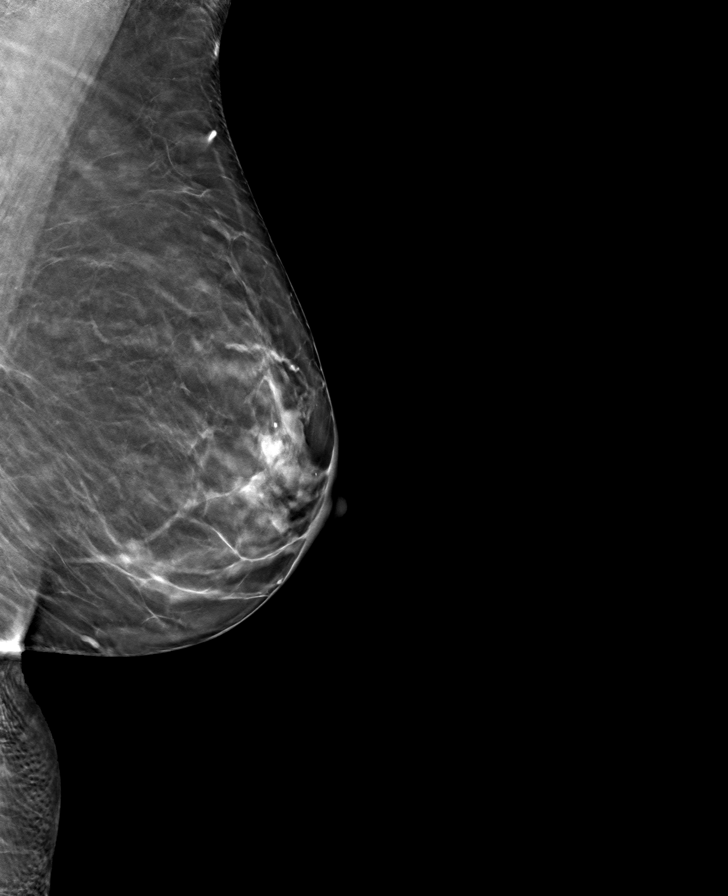

[L CC tomo · tomo slice 33/66.0]
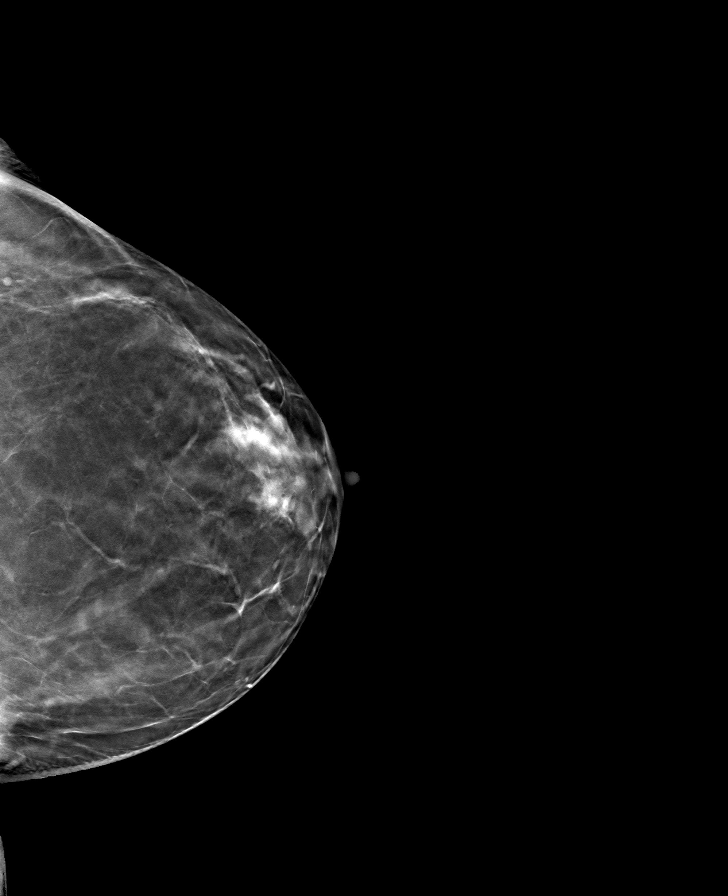

[8 of 24 positions shown; findings below may reference images not displayed]

ACR Breast Density Category b: There are scattered areas of
fibroglandular density.
FINDINGS: There are no findings suspicious for malignancy.
IMPRESSION: No mammographic evidence of malignancy. A result letter of this
screening mammogram will be mailed directly to the patient.

RECOMMENDATION:
Screening mammogram in one year. (Code:51-O-LD2)

BI-RADS CATEGORY  1: Negative.

## 2023-03-01 DIAGNOSIS — Z961 Presence of intraocular lens: Secondary | ICD-10-CM | POA: Diagnosis not present

## 2023-03-01 DIAGNOSIS — H43813 Vitreous degeneration, bilateral: Secondary | ICD-10-CM | POA: Diagnosis not present

## 2023-03-01 DIAGNOSIS — H524 Presbyopia: Secondary | ICD-10-CM | POA: Diagnosis not present

## 2023-04-12 DIAGNOSIS — R1310 Dysphagia, unspecified: Secondary | ICD-10-CM | POA: Diagnosis not present

## 2023-04-12 DIAGNOSIS — R194 Change in bowel habit: Secondary | ICD-10-CM | POA: Diagnosis not present

## 2023-04-12 DIAGNOSIS — R159 Full incontinence of feces: Secondary | ICD-10-CM | POA: Diagnosis not present

## 2023-04-14 DIAGNOSIS — D124 Benign neoplasm of descending colon: Secondary | ICD-10-CM | POA: Diagnosis not present

## 2023-04-14 DIAGNOSIS — K3189 Other diseases of stomach and duodenum: Secondary | ICD-10-CM | POA: Diagnosis not present

## 2023-04-14 DIAGNOSIS — R1310 Dysphagia, unspecified: Secondary | ICD-10-CM | POA: Diagnosis not present

## 2023-04-14 DIAGNOSIS — K2930 Chronic superficial gastritis without bleeding: Secondary | ICD-10-CM | POA: Diagnosis not present

## 2023-04-14 DIAGNOSIS — K6289 Other specified diseases of anus and rectum: Secondary | ICD-10-CM | POA: Diagnosis not present

## 2023-04-14 DIAGNOSIS — K635 Polyp of colon: Secondary | ICD-10-CM | POA: Diagnosis not present

## 2023-04-14 DIAGNOSIS — R197 Diarrhea, unspecified: Secondary | ICD-10-CM | POA: Diagnosis not present

## 2023-04-14 DIAGNOSIS — K2289 Other specified disease of esophagus: Secondary | ICD-10-CM | POA: Diagnosis not present

## 2023-04-14 DIAGNOSIS — K449 Diaphragmatic hernia without obstruction or gangrene: Secondary | ICD-10-CM | POA: Diagnosis not present

## 2023-04-18 DIAGNOSIS — K2289 Other specified disease of esophagus: Secondary | ICD-10-CM | POA: Diagnosis not present

## 2023-04-18 DIAGNOSIS — D124 Benign neoplasm of descending colon: Secondary | ICD-10-CM | POA: Diagnosis not present

## 2023-04-18 DIAGNOSIS — K2930 Chronic superficial gastritis without bleeding: Secondary | ICD-10-CM | POA: Diagnosis not present

## 2023-05-04 DIAGNOSIS — M170 Bilateral primary osteoarthritis of knee: Secondary | ICD-10-CM | POA: Diagnosis not present

## 2023-06-15 DIAGNOSIS — M170 Bilateral primary osteoarthritis of knee: Secondary | ICD-10-CM | POA: Diagnosis not present

## 2023-06-23 DIAGNOSIS — R3915 Urgency of urination: Secondary | ICD-10-CM | POA: Diagnosis not present

## 2023-06-23 DIAGNOSIS — R159 Full incontinence of feces: Secondary | ICD-10-CM | POA: Diagnosis not present

## 2023-06-23 DIAGNOSIS — R152 Fecal urgency: Secondary | ICD-10-CM | POA: Diagnosis not present

## 2023-06-23 DIAGNOSIS — M6281 Muscle weakness (generalized): Secondary | ICD-10-CM | POA: Diagnosis not present

## 2023-06-29 DIAGNOSIS — R159 Full incontinence of feces: Secondary | ICD-10-CM | POA: Diagnosis not present

## 2023-06-29 DIAGNOSIS — M6281 Muscle weakness (generalized): Secondary | ICD-10-CM | POA: Diagnosis not present

## 2023-06-29 DIAGNOSIS — R3915 Urgency of urination: Secondary | ICD-10-CM | POA: Diagnosis not present

## 2023-06-29 DIAGNOSIS — R152 Fecal urgency: Secondary | ICD-10-CM | POA: Diagnosis not present

## 2023-07-05 DIAGNOSIS — Z8719 Personal history of other diseases of the digestive system: Secondary | ICD-10-CM | POA: Diagnosis not present

## 2023-07-05 DIAGNOSIS — Z860100 Personal history of colon polyps, unspecified: Secondary | ICD-10-CM | POA: Diagnosis not present

## 2023-07-05 DIAGNOSIS — R159 Full incontinence of feces: Secondary | ICD-10-CM | POA: Diagnosis not present

## 2023-07-05 DIAGNOSIS — K6289 Other specified diseases of anus and rectum: Secondary | ICD-10-CM | POA: Diagnosis not present

## 2023-07-05 NOTE — Progress Notes (Unsigned)
  Electrophysiology Office Note:   Date:  07/06/2023  ID:  [facility] [place], [place] Nov 03, 1945, MRN [id number]  Primary Cardiologist: Mickiel Albany, MD (Inactive) Electrophysiologist: Manya Sells, MD      History of Present Illness:   Marie Farrell is a 78 y.o. female with h/o SVT and PVCs seen today for routine electrophysiology followup.   Since last being seen in our clinic the patient reports doing well from a cardiac perspective. Rare palpitations but never for more than a minute or two.  She did notice her HR taking about 5-10 minutes to come down from exercise within the past couple of weeks, but doesn't remember what the HR was, and didn't have any specific symptoms along with it. Otherwise, she denies chest pain, palpitations, dyspnea, PND, orthopnea, nausea, vomiting, dizziness, syncope, edema, weight gain, or early satiety.   Review of systems complete and found to be negative unless listed in HPI.   EP Information / Studies Reviewed:    EKG is ordered today. Personal review as below.  EKG Interpretation Date/Time:  Thursday July 06 2023 11:21:29 EDT Ventricular Rate:  64 PR Interval:  170 QRS Duration:  76 QT Interval:  406 QTC Calculation: 418 R Axis:   32  Text Interpretation: Normal sinus rhythm Normal ECG When compared with ECG of 07-Oct-2021 11:12, No significant change was found Confirmed by Pilar Bridge [phone]) on 07/06/2023 11:30:09 AM    Arrhythmia/Device History No specialty comments available.   Physical Exam:   VS:  BP 136/70 (BP Location: Left Arm, Patient Position: Sitting, Cuff Size: Normal)   Pulse 64   Ht 5' 5.5" (1.664 m)   Wt 169 lb (76.7 kg)   SpO2 97%   BMI 27.70 kg/m    Wt Readings from Last 3 Encounters:  07/06/23 169 lb (76.7 kg)  06/09/22 167 lb 3.2 oz (75.8 kg)  12/02/21 168 lb 9.6 oz (76.5 kg)     GEN: No acute distress NECK: No JVD; No carotid bruits CARDIAC: Regular rate and rhythm, no murmurs, rubs,  gallops RESPIRATORY:  Clear to auscultation without rales, wheezing or rhonchi  ABDOMEN: Soft, non-tender, non-distended EXTREMITIES:  No edema; No deformity   ASSESSMENT AND PLAN:    SVT PVCs EKG today shows NSR Continue lopressor  25 mg BID If she has breakthrough or is intolerant of the SVT, she would prefer AAD conversations, not interested in ablation consideration at this time.    Follow up with EP APP in 12 months. Offered as needed follow up with PCP managing lopressor ; she will let us  know if she prefers this.   Signed, Tylene Galla, PA-C

## 2023-07-06 ENCOUNTER — Ambulatory Visit: Payer: Medicare PPO | Attending: Student | Admitting: Student

## 2023-07-06 ENCOUNTER — Encounter: Payer: Self-pay | Admitting: Student

## 2023-07-06 VITALS — BP 136/70 | HR 64 | Ht 65.5 in | Wt 169.0 lb

## 2023-07-06 DIAGNOSIS — I493 Ventricular premature depolarization: Secondary | ICD-10-CM

## 2023-07-06 DIAGNOSIS — R3915 Urgency of urination: Secondary | ICD-10-CM | POA: Diagnosis not present

## 2023-07-06 DIAGNOSIS — I471 Supraventricular tachycardia, unspecified: Secondary | ICD-10-CM | POA: Diagnosis not present

## 2023-07-06 DIAGNOSIS — M6281 Muscle weakness (generalized): Secondary | ICD-10-CM | POA: Diagnosis not present

## 2023-07-06 DIAGNOSIS — R152 Fecal urgency: Secondary | ICD-10-CM | POA: Diagnosis not present

## 2023-07-06 DIAGNOSIS — R159 Full incontinence of feces: Secondary | ICD-10-CM | POA: Diagnosis not present

## 2023-07-06 NOTE — Patient Instructions (Signed)
 Medication Instructions:  Your physician recommends that you continue on your current medications as directed. Please refer to the Current Medication list given to you today.  *If you need a refill on your cardiac medications before your next appointment, please call your pharmacy*  Lab Work: None ordered If you have labs (blood work) drawn today and your tests are completely normal, you will receive your results only by: MyChart Message (if you have MyChart) OR A paper copy in the mail If you have any lab test that is abnormal or we need to change your treatment, we will call you to review the results.  Follow-Up: At [facility], you and your health needs are our priority.  As part of our continuing mission to provide you with exceptional heart care, our providers are all part of one team.  This team includes your primary Cardiologist (physician) and Advanced Practice Providers or APPs (Physician Assistants and Nurse Practitioners) who all work together to provide you with the care you need, when you need it.  Your next appointment:   1 year  Provider:   You will see one of the following Advanced Practice Providers on your designated Care Team:   Marie Farrell, [place] Marie Lever "Jonelle Neri" [place], PA-C Marie Doffing, NP    1st Floor: - Lobby - Registration  - Pharmacy  - Lab - Cafe  2nd Floor: - PV Lab - Diagnostic Testing (echo, CT, nuclear med)  3rd Floor: - Vacant  4th Floor: - TCTS (cardiothoracic surgery) - AFib Clinic - Structural Heart Clinic - Vascular Surgery  - Vascular Ultrasound  5th Floor: - HeartCare Cardiology (general and EP) - Clinical Pharmacy for coumadin, hypertension, lipid, weight-loss medications, and med management appointments    Valet parking services will be available as well.

## 2023-08-10 DIAGNOSIS — M170 Bilateral primary osteoarthritis of knee: Secondary | ICD-10-CM | POA: Diagnosis not present

## 2023-08-15 DIAGNOSIS — R3915 Urgency of urination: Secondary | ICD-10-CM | POA: Diagnosis not present

## 2023-08-15 DIAGNOSIS — R159 Full incontinence of feces: Secondary | ICD-10-CM | POA: Diagnosis not present

## 2023-08-15 DIAGNOSIS — M6281 Muscle weakness (generalized): Secondary | ICD-10-CM | POA: Diagnosis not present

## 2023-08-15 DIAGNOSIS — R152 Fecal urgency: Secondary | ICD-10-CM | POA: Diagnosis not present

## 2023-08-17 DIAGNOSIS — M170 Bilateral primary osteoarthritis of knee: Secondary | ICD-10-CM | POA: Diagnosis not present

## 2023-08-24 DIAGNOSIS — M170 Bilateral primary osteoarthritis of knee: Secondary | ICD-10-CM | POA: Diagnosis not present

## 2023-09-12 DIAGNOSIS — M6281 Muscle weakness (generalized): Secondary | ICD-10-CM | POA: Diagnosis not present

## 2023-09-12 DIAGNOSIS — R152 Fecal urgency: Secondary | ICD-10-CM | POA: Diagnosis not present

## 2023-09-12 DIAGNOSIS — R3915 Urgency of urination: Secondary | ICD-10-CM | POA: Diagnosis not present

## 2023-09-12 DIAGNOSIS — R159 Full incontinence of feces: Secondary | ICD-10-CM | POA: Diagnosis not present

## 2023-09-13 DIAGNOSIS — M170 Bilateral primary osteoarthritis of knee: Secondary | ICD-10-CM | POA: Diagnosis not present

## 2023-10-06 DIAGNOSIS — U071 COVID-19: Secondary | ICD-10-CM | POA: Diagnosis not present

## 2023-10-06 DIAGNOSIS — B974 Respiratory syncytial virus as the cause of diseases classified elsewhere: Secondary | ICD-10-CM | POA: Diagnosis not present

## 2023-10-06 DIAGNOSIS — Z03818 Encounter for observation for suspected exposure to other biological agents ruled out: Secondary | ICD-10-CM | POA: Diagnosis not present

## 2023-10-06 DIAGNOSIS — R051 Acute cough: Secondary | ICD-10-CM | POA: Diagnosis not present

## 2023-10-18 ENCOUNTER — Other Ambulatory Visit: Payer: Self-pay | Admitting: Internal Medicine

## 2023-10-18 DIAGNOSIS — Z1231 Encounter for screening mammogram for malignant neoplasm of breast: Secondary | ICD-10-CM

## 2023-10-21 ENCOUNTER — Other Ambulatory Visit: Payer: Self-pay | Admitting: Internal Medicine

## 2023-11-08 DIAGNOSIS — R3915 Urgency of urination: Secondary | ICD-10-CM | POA: Diagnosis not present

## 2023-11-08 DIAGNOSIS — M6281 Muscle weakness (generalized): Secondary | ICD-10-CM | POA: Diagnosis not present

## 2023-11-08 DIAGNOSIS — R159 Full incontinence of feces: Secondary | ICD-10-CM | POA: Diagnosis not present

## 2023-11-08 DIAGNOSIS — R152 Fecal urgency: Secondary | ICD-10-CM | POA: Diagnosis not present

## 2023-11-21 ENCOUNTER — Ambulatory Visit
Admission: RE | Admit: 2023-11-21 | Discharge: 2023-11-21 | Disposition: A | Discharge status: Home / Self Care | Source: Ambulatory Visit | Attending: Internal Medicine | Admitting: Internal Medicine

## 2023-11-21 DIAGNOSIS — Z1231 Encounter for screening mammogram for malignant neoplasm of breast: Secondary | ICD-10-CM

## 2023-12-01 DIAGNOSIS — R159 Full incontinence of feces: Secondary | ICD-10-CM | POA: Diagnosis not present

## 2023-12-01 DIAGNOSIS — R152 Fecal urgency: Secondary | ICD-10-CM | POA: Diagnosis not present

## 2023-12-01 DIAGNOSIS — R3915 Urgency of urination: Secondary | ICD-10-CM | POA: Diagnosis not present

## 2023-12-01 DIAGNOSIS — M6281 Muscle weakness (generalized): Secondary | ICD-10-CM | POA: Diagnosis not present

## 2023-12-07 DIAGNOSIS — M6281 Muscle weakness (generalized): Secondary | ICD-10-CM | POA: Diagnosis not present

## 2023-12-07 DIAGNOSIS — E7849 Other hyperlipidemia: Secondary | ICD-10-CM | POA: Diagnosis not present

## 2023-12-07 DIAGNOSIS — Z1212 Encounter for screening for malignant neoplasm of rectum: Secondary | ICD-10-CM | POA: Diagnosis not present

## 2023-12-07 DIAGNOSIS — R152 Fecal urgency: Secondary | ICD-10-CM | POA: Diagnosis not present

## 2023-12-07 DIAGNOSIS — R3915 Urgency of urination: Secondary | ICD-10-CM | POA: Diagnosis not present

## 2023-12-07 DIAGNOSIS — E7800 Pure hypercholesterolemia, unspecified: Secondary | ICD-10-CM | POA: Diagnosis not present

## 2023-12-07 DIAGNOSIS — E785 Hyperlipidemia, unspecified: Secondary | ICD-10-CM | POA: Diagnosis not present

## 2023-12-07 DIAGNOSIS — R159 Full incontinence of feces: Secondary | ICD-10-CM | POA: Diagnosis not present

## 2023-12-07 DIAGNOSIS — Z1389 Encounter for screening for other disorder: Secondary | ICD-10-CM | POA: Diagnosis not present

## 2023-12-07 DIAGNOSIS — M8580 Other specified disorders of bone density and structure, unspecified site: Secondary | ICD-10-CM | POA: Diagnosis not present

## 2023-12-14 DIAGNOSIS — R82998 Other abnormal findings in urine: Secondary | ICD-10-CM | POA: Diagnosis not present

## 2023-12-14 DIAGNOSIS — E7800 Pure hypercholesterolemia, unspecified: Secondary | ICD-10-CM | POA: Diagnosis not present

## 2023-12-14 DIAGNOSIS — M8580 Other specified disorders of bone density and structure, unspecified site: Secondary | ICD-10-CM | POA: Diagnosis not present

## 2023-12-14 DIAGNOSIS — Z0000 Encounter for general adult medical examination without abnormal findings: Secondary | ICD-10-CM | POA: Diagnosis not present

## 2023-12-14 DIAGNOSIS — I872 Venous insufficiency (chronic) (peripheral): Secondary | ICD-10-CM | POA: Diagnosis not present

## 2023-12-14 DIAGNOSIS — Z23 Encounter for immunization: Secondary | ICD-10-CM | POA: Diagnosis not present

## 2023-12-14 DIAGNOSIS — E785 Hyperlipidemia, unspecified: Secondary | ICD-10-CM | POA: Diagnosis not present

## 2023-12-14 DIAGNOSIS — Z85820 Personal history of malignant melanoma of skin: Secondary | ICD-10-CM | POA: Diagnosis not present

## 2023-12-14 DIAGNOSIS — F418 Other specified anxiety disorders: Secondary | ICD-10-CM | POA: Diagnosis not present

## 2023-12-14 DIAGNOSIS — I471 Supraventricular tachycardia, unspecified: Secondary | ICD-10-CM | POA: Diagnosis not present

## 2023-12-20 DIAGNOSIS — D1801 Hemangioma of skin and subcutaneous tissue: Secondary | ICD-10-CM | POA: Diagnosis not present

## 2023-12-20 DIAGNOSIS — L814 Other melanin hyperpigmentation: Secondary | ICD-10-CM | POA: Diagnosis not present

## 2023-12-20 DIAGNOSIS — L578 Other skin changes due to chronic exposure to nonionizing radiation: Secondary | ICD-10-CM | POA: Diagnosis not present

## 2023-12-20 DIAGNOSIS — D485 Neoplasm of uncertain behavior of skin: Secondary | ICD-10-CM | POA: Diagnosis not present

## 2023-12-20 DIAGNOSIS — L570 Actinic keratosis: Secondary | ICD-10-CM | POA: Diagnosis not present

## 2023-12-20 DIAGNOSIS — L821 Other seborrheic keratosis: Secondary | ICD-10-CM | POA: Diagnosis not present

## 2023-12-20 DIAGNOSIS — D2362 Other benign neoplasm of skin of left upper limb, including shoulder: Secondary | ICD-10-CM | POA: Diagnosis not present

## 2023-12-21 DIAGNOSIS — R159 Full incontinence of feces: Secondary | ICD-10-CM | POA: Diagnosis not present

## 2023-12-21 DIAGNOSIS — R152 Fecal urgency: Secondary | ICD-10-CM | POA: Diagnosis not present

## 2023-12-26 DIAGNOSIS — D485 Neoplasm of uncertain behavior of skin: Secondary | ICD-10-CM | POA: Diagnosis not present

## 2023-12-26 DIAGNOSIS — C4402 Squamous cell carcinoma of skin of lip: Secondary | ICD-10-CM | POA: Diagnosis not present

## 2023-12-29 DIAGNOSIS — R159 Full incontinence of feces: Secondary | ICD-10-CM | POA: Diagnosis not present

## 2023-12-29 DIAGNOSIS — R152 Fecal urgency: Secondary | ICD-10-CM | POA: Diagnosis not present

## 2024-01-04 DIAGNOSIS — M170 Bilateral primary osteoarthritis of knee: Secondary | ICD-10-CM | POA: Diagnosis not present

## 2024-01-30 DIAGNOSIS — C4402 Squamous cell carcinoma of skin of lip: Secondary | ICD-10-CM | POA: Diagnosis not present

## 2024-01-30 DIAGNOSIS — Z85828 Personal history of other malignant neoplasm of skin: Secondary | ICD-10-CM | POA: Diagnosis not present

## 2024-02-06 DIAGNOSIS — Z01419 Encounter for gynecological examination (general) (routine) without abnormal findings: Secondary | ICD-10-CM | POA: Diagnosis not present

## 2024-03-28 ENCOUNTER — Other Ambulatory Visit (HOSPITAL_COMMUNITY): Payer: Self-pay | Admitting: Physician Assistant

## 2024-03-28 DIAGNOSIS — M79604 Pain in right leg: Secondary | ICD-10-CM

## 2024-03-29 ENCOUNTER — Ambulatory Visit (HOSPITAL_COMMUNITY)
Admission: RE | Admit: 2024-03-29 | Discharge: 2024-03-29 | Disposition: A | Discharge status: Home / Self Care | Source: Ambulatory Visit | Attending: Vascular Surgery | Admitting: Vascular Surgery

## 2024-03-29 DIAGNOSIS — M79605 Pain in left leg: Secondary | ICD-10-CM | POA: Insufficient documentation

## 2024-03-29 DIAGNOSIS — M79604 Pain in right leg: Secondary | ICD-10-CM | POA: Insufficient documentation

## 2024-04-15 ENCOUNTER — Other Ambulatory Visit: Payer: Self-pay | Admitting: Internal Medicine

## 2024-07-16 ENCOUNTER — Ambulatory Visit: Admitting: Student
